# Patient Record
Sex: Female | Born: 1975 | Race: White | Hispanic: No | Marital: Married | Smoking: Former smoker
Health system: Southern US, Community
[De-identification: ages and names within clinical notes are randomized; demographics above are authoritative.]

## PROBLEM LIST (undated history)

## (undated) DIAGNOSIS — T753XXA Motion sickness, initial encounter: Secondary | ICD-10-CM

## (undated) DIAGNOSIS — R51 Headache: Secondary | ICD-10-CM

## (undated) DIAGNOSIS — R112 Nausea with vomiting, unspecified: Secondary | ICD-10-CM

## (undated) DIAGNOSIS — J329 Chronic sinusitis, unspecified: Secondary | ICD-10-CM

## (undated) DIAGNOSIS — Z803 Family history of malignant neoplasm of breast: Secondary | ICD-10-CM

## (undated) DIAGNOSIS — K219 Gastro-esophageal reflux disease without esophagitis: Secondary | ICD-10-CM

## (undated) DIAGNOSIS — R519 Headache, unspecified: Secondary | ICD-10-CM

## (undated) DIAGNOSIS — Z9889 Other specified postprocedural states: Secondary | ICD-10-CM

## (undated) DIAGNOSIS — J342 Deviated nasal septum: Secondary | ICD-10-CM

## (undated) HISTORY — DX: Family history of malignant neoplasm of breast: Z80.3

---
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
## 2007-09-25 ENCOUNTER — Emergency Department: Payer: Self-pay | Admitting: Internal Medicine

## 2007-09-27 HISTORY — PX: LAPAROSCOPY: SHX197

## 2007-10-19 ENCOUNTER — Ambulatory Visit: Payer: Self-pay | Admitting: Unknown Physician Specialty

## 2007-10-25 ENCOUNTER — Ambulatory Visit: Payer: Self-pay | Admitting: Unknown Physician Specialty

## 2008-06-06 ENCOUNTER — Ambulatory Visit: Payer: Self-pay | Admitting: Urology

## 2008-08-28 ENCOUNTER — Ambulatory Visit: Payer: Self-pay | Admitting: Obstetrics & Gynecology

## 2009-07-11 ENCOUNTER — Observation Stay: Payer: Self-pay | Admitting: Obstetrics and Gynecology

## 2009-07-22 ENCOUNTER — Inpatient Hospital Stay: Payer: Self-pay

## 2013-08-23 ENCOUNTER — Observation Stay: Payer: Self-pay

## 2013-08-28 ENCOUNTER — Inpatient Hospital Stay: Payer: Self-pay | Admitting: Obstetrics and Gynecology

## 2013-08-28 LAB — CBC WITH DIFFERENTIAL/PLATELET
Basophil #: 0 10*3/uL (ref 0.0–0.1)
Basophil %: 0.4 %
Eosinophil #: 0.1 10*3/uL (ref 0.0–0.7)
Eosinophil %: 1.3 %
HCT: 35.3 % (ref 35.0–47.0)
HGB: 12.3 g/dL (ref 12.0–16.0)
Lymphocyte #: 1.1 10*3/uL (ref 1.0–3.6)
Lymphocyte %: 15.6 %
MCH: 29.8 pg (ref 26.0–34.0)
MCHC: 34.8 g/dL (ref 32.0–36.0)
MCV: 86 fL (ref 80–100)
Monocyte #: 0.4 x10 3/mm (ref 0.2–0.9)
Monocyte %: 6 %
Neutrophil #: 5.6 10*3/uL (ref 1.4–6.5)
Neutrophil %: 76.7 %
Platelet: 138 10*3/uL — ABNORMAL LOW (ref 150–440)
RBC: 4.13 10*6/uL (ref 3.80–5.20)
RDW: 14.1 % (ref 11.5–14.5)
WBC: 7.3 10*3/uL (ref 3.6–11.0)

## 2013-08-29 LAB — HEMATOCRIT: HCT: 32.7 % — ABNORMAL LOW (ref 35.0–47.0)

## 2014-01-15 ENCOUNTER — Ambulatory Visit: Payer: Self-pay | Admitting: Obstetrics & Gynecology

## 2014-01-15 LAB — CBC
HCT: 40.8 % (ref 35.0–47.0)
HGB: 14.1 g/dL (ref 12.0–16.0)
MCH: 30 pg (ref 26.0–34.0)
MCHC: 34.5 g/dL (ref 32.0–36.0)
MCV: 87 fL (ref 80–100)
Platelet: 180 10*3/uL (ref 150–440)
RBC: 4.69 10*6/uL (ref 3.80–5.20)
RDW: 13.3 % (ref 11.5–14.5)
WBC: 5.3 10*3/uL (ref 3.6–11.0)

## 2014-01-21 ENCOUNTER — Ambulatory Visit: Payer: Self-pay | Admitting: Obstetrics & Gynecology

## 2014-01-21 HISTORY — PX: TUBAL LIGATION: SHX77

## 2014-04-22 ENCOUNTER — Ambulatory Visit: Payer: Self-pay | Admitting: Family Medicine

## 2015-01-17 NOTE — Op Note (Signed)
PATIENT NAME:  Desiree Lee, Desiree Lee MR#:  [id number] DATE OF BIRTH:  1975-11-24  DATE OF PROCEDURE:  01/21/2014  PREOPERATIVE DIAGNOSIS: Desire for permanent sterility.   POSTOPERATIVE DIAGNOSIS: Desire for permanent sterility.  PROCEDURE PERFORMED: Operative laparoscopy with bilateral tubal ligation using Hulka clips.   SURGEON: Barnett Applebaum, M.D.   ANESTHESIA: General.   ESTIMATED BLOOD LOSS: Minimal.   COMPLICATIONS: None.   FINDINGS: Normal tubes, ovaries, uterus, appendix, gallbladder and liver.   DISPOSITION: To the recovery room in stable condition.   TECHNIQUE: The patient is prepped and draped in the usual sterile fashion after adequate anesthesia is obtained in the dorsal lithotomy position. Bladder was drained with a Robinson catheter and a sponge stick is placed in the vagina for manipulation purposes.   Attention is then turned to the abdomen where a Veress needle is inserted through a 5 mm infraumbilical incision after Marcaine is used to anesthetize the skin. Veress needle placement is confirmed using the hanging drop technique and the abdomen is then insufflated with CO2 gas. A 5 mm trocar is then inserted under direct visualization with the laparoscope with no injuries or bleeding noted. The patient is placed in Trendelenburg position and the above-mentioned findings are visualized.   A suprapubic incision is made and an 11 mm trocar is placed. Hulka clips were applied to the left fallopian tube after it was identified out to its fimbria. Two Hulka clips are placed without difficulty. On the right side, 2 Hulka clips are placed in the midportion of fallopian tube after the fimbria is identified. Excellent placement is noted for all 4 Hulka clips and no bleeding or complications are noted. The patient is leveled. Gas is expelled. Trocars are removed and Dermabond is applied to skin. Sponge stick is removed vaginally. The patient goes to the recovery room in stable condition. All  sponge, instrument, and needle counts are correct.   ____________________________ R. Barnett Applebaum, MD rph:aw D: 01/21/2014 09:46:47 ET T: 01/21/2014 10:52:43 ET JOB#: [id number]  cc: Glean Salen, MD, <Dictator> Gae Dry MD ELECTRONICALLY SIGNED 01/21/2014 13:23

## 2015-02-03 NOTE — H&P (Signed)
L&D Evaluation:  History:  HPI 39 year old G5 P2022 with EDC=08/29/2013 by LMP=11/22/2012 and confirmed with a 6wk1day [place] (-3 days) presented at 20 1/7 weeks with c/o contractions since 0830 this AM. Denies VB, LOF. Baby active.PNC at [facility] remarkable for [place] with negative NIFT and an elevated 1 hr GTT with a normal 3hr GTT.  O POS, RI, VI, GBS positive.   Presents with contractions   Patient's Medical History Past hx of bipolar disorder-not on meds, allergic rhinitis, endometriosis   Patient's Surgical History dx laparoscopy   Medications Pre [place] Vitamins  GERD med (Prevacid vs Prilosec)   Allergies Bactrim-hives   Social History none   Family History Non-Contributory   Exam:  Vital Signs stable  118/72   Urine Protein not completed   General no apparent distress   Mental Status clear   Chest clear   Abdomen gravid, tender with contractions   Estimated Fetal Weight Average for gestational age   Fetal Position OP   Edema no edema   Reflexes 1+   Pelvic no external lesions, after 5 1/2 hours of observation, cx remains 1.5/50-60%/-1/posterior   Mebranes Intact   FHT normal rate with no decels, 135-140 with accels to 160s -170s   FHT Description CAT 1   Ucx q4-73min apart   Skin dry   Impression:  Impression IUP at 39 1/7 weeks. False vs prodromal labor   Plan:  Plan DC home with labor precautions. Discussed comfort measures-bath, rest, food/fluids.   Electronic Signatures: Karene Fry (CNM)  [phone] 22:39)  Authored: L&D Evaluation   Last Updated: 28-Nov-14 22:39 by Karene Fry (CNM)

## 2015-02-03 NOTE — H&P (Signed)
L&D Evaluation:  History:  HPI 39 year old G5 P2022 with EDC=08/29/2013 by LMP=11/22/2012 and confirmed with a 6wk1day [place] (-3 days) presented at 27 6/7 weeks for an elective IOL due to hx of fast active labor.  Denies VB. Has had a show yesterday. Has irregular ctxs. Baby active.PNC at [facility] remarkable for [facility] with negative NIFT and an elevated 1 hr GTT with a normal 3hr GTT. She takes Prilosec for reflux/heartburn. O POS, RI, VI, GBS positive. She had her TDAP and flu vaccine on 06/19/13.   Presents with IOL   Patient's Medical History Past hx of bipolar disorder-not on meds, allergic rhinitis, endometriosis, GERD   Patient's Surgical History dx laparoscopy   Medications Pre [place] Vitamins  Prilosec 20 mgm once or twice a day. Zyrtec   Allergies Bactrim-hives   Social History none   Family History Non-Contributory   ROS:  ROS see HPI   Exam:  Vital Signs 122/70   Urine Protein not completed   General no apparent distress   Mental Status clear   Chest clear   Heart normal sinus rhythm, no murmur/gallop/rubs   Abdomen gravid, non-tender   Estimated Fetal Weight Average for gestational age   Fetal Position cephalic   Edema trace   Reflexes 1+   Pelvic no external lesions, 3/60%/-1   Mebranes Intact   FHT normal rate with no decels, 135 baseline with accels- Cat1   Fetal Heart Rate 135   Ucx occ   Skin dry   Impression:  Impression IUP at 39 6/7 weeks for elective IOL. +GBS   Plan:  Plan EFM/NST, monitor contractions and for cervical change, antibiotics for GBBS prophylaxis, Will start GBS prophyllaxis, then Pitocin. Explained risks of hyoperstimulation, FITL, C-section, and failed IOL. Patient adamant about proceeding with IOL.   Electronic Signatures: Karene Fry (CNM)  (Signed 03-Dec-14 09:33)  Authored: L&D Evaluation   Last Updated: 03-Dec-14 09:33 by Karene Fry (CNM)

## 2016-01-20 ENCOUNTER — Encounter: Payer: Self-pay | Admitting: *Deleted

## 2016-01-25 NOTE — Discharge Instructions (Signed)
[facility] °[facility] °ENDOSCOPIC SINUS SURGERY °[facility] ° °What is Functional Endoscopic Sinus Surgery? ° The Surgery involves making the natural openings of the sinuses larger by removing the bony partitions that separate the sinuses from the nasal cavity.  The natural sinus lining is preserved as much as possible to allow the sinuses to resume normal function after the surgery.  In some patients nasal polyps (excessively swollen lining of the sinuses) may be removed to relieve obstruction of the sinus openings.  The surgery is performed through the nose using lighted scopes, which eliminates the need for incisions on the face.  A septoplasty is a different procedure which is sometimes performed with sinus surgery.  It involves straightening the boy partition that separates the two sides of your nose.  A crooked or deviated septum may need repair if is obstructing the sinuses or nasal airflow.  Turbinate reduction is also often performed during sinus surgery.  The turbinates are bony proturberances from the side walls of the nose which swell and can obstruct the nose in patients with sinus and allergy problems.  Their size can be surgically reduced to help relieve nasal obstruction. ° °What Can Sinus Surgery Do For Me? ° Sinus surgery can reduce the frequency of sinus infections requiring antibiotic treatment.  This can provide improvement in nasal congestion, post-nasal drainage, facial pressure and nasal obstruction.  Surgery will NOT prevent you from ever having an infection again, so it usually only for patients who get infections 4 or more times yearly requiring antibiotics, or for infections that do not clear with antibiotics.  It will not cure nasal allergies, so patients with allergies may still require medication to treat their allergies after surgery. Surgery may improve headaches related to sinusitis, however, some people will continue to  require medication to control sinus headaches related to allergies.  Surgery will do nothing for other forms of headache (migraine, tension or cluster). ° °What Are the Risks of Endoscopic Sinus Surgery? ° Current techniques allow surgery to be performed safely with little risk, however, there are rare complications that patients should be aware of.  Because the sinuses are located around the eyes, there is risk of eye injury, including blindness, though again, this would be quite rare. This is usually a result of bleeding behind the eye during surgery, which puts the vision oat risk, though there are treatments to protect the vision and prevent permanent disrupted by surgery causing a leak of the spinal fluid that surrounds the brain.  More serious complications would include bleeding inside the brain cavity or damage to the brain.  Again, all of these complications are uncommon, and spinal fluid leaks can be safely managed surgically if they occur.  The most common complication of sinus surgery is bleeding from the nose, which may require packing or cauterization of the nose.  Continued sinus have polyps may experience recurrence of the polyps requiring revision surgery.  Alterations of sense of smell or injury to the tear ducts are also rare complications.  ° °What is the Surgery Like, and what is the Recovery? ° The Surgery usually takes a couple of hours to perform, and is usually performed under a general anesthetic (completely asleep).  Patients are usually discharged home after a couple of hours.  Sometimes during surgery it is necessary to pack the nose to control bleeding, and the packing is left in place for 24 - 48 hours, and removed by your surgeon.    If a septoplasty was performed during the procedure, there is often a splint placed which must be removed after 5-7 days.   °Discomfort: Pain is usually mild to moderate, and can be controlled by prescription pain medication or acetaminophen (Tylenol).   Aspirin, Ibuprofen (Advil, Motrin), or Naprosyn (Aleve) should be avoided, as they can cause increased bleeding.  Most patients feel sinus pressure like they have a bad head cold for several days.  Sleeping with your head elevated can help reduce swelling and facial pressure, as can ice packs over the face.  A humidifier may be helpful to keep the mucous and blood from drying in the nose.  ° °Diet: There are no specific diet restrictions, however, you should generally start with clear liquids and a light diet of bland foods because the anesthetic can cause some nausea.  Advance your diet depending on how your stomach feels.  Taking your pain medication with food will often help reduce stomach upset which pain medications can cause. ° °Nasal Saline Irrigation: It is important to remove blood clots and dried mucous from the nose as it is healing.  This is done by having you irrigate the nose at least 3 - 4 times daily with a salt water solution.  We recommend using NeilMed Sinus Rinse (available at the drug store).  Fill the squeeze bottle with the solution, bend over a sink, and insert the tip of the squeeze bottle into the nose ½ of an inch.  Point the tip of the squeeze bottle towards the inside corner of the eye on the same side your irrigating.  Squeeze the bottle and gently irrigate the nose.  If you bend forward as you do this, most of the fluid will flow back out of the nose, instead of down your throat.   The solution should be warm, near body temperature, when you irrigate.   Each time you irrigate, you should use a full squeeze bottle.  ° °Note that if you are instructed to use Nasal Steroid Sprays at any time after your surgery, irrigate with saline BEFORE using the steroid spray, so you do not wash it all out of the nose. °Another product, Nasal Saline Gel (such as AYR Nasal Saline Gel) can be applied in each nostril 3 - 4 times daily to moisture the nose and reduce scabbing or crusting. ° °Bleeding:   Bloody drainage from the nose can be expected for several days, and patients are instructed to irrigate their nose frequently with salt water to help remove mucous and blood clots.  The drainage may be dark red or brown, though some fresh blood may be seen intermittently, especially after irrigation.  Do not blow you nose, as bleeding may occur. If you must sneeze, keep your mouth open to allow air to escape through your mouth. ° °If heavy bleeding occurs: Irrigate the nose with saline to rinse out clots, then spray the nose 3 - 4 times with Afrin Nasal Decongestant Spray.  The spray will constrict the blood vessels to slow bleeding.  Pinch the lower half of your nose shut to apply pressure, and lay down with your head elevated.  Ice packs over the nose may help as well. If bleeding persists despite these measures, you should notify your doctor.  Do not use the Afrin routinely to control nasal congestion after surgery, as it can result in worsening congestion and may affect healing.  ° ° ° °Activity: Return to work varies among patients. Most patients will be   out of work at least 5 - 7 days to recover.  Patient may return to work after they are off of narcotic pain medication, and feeling well enough to perform the functions of their job.  Patients must avoid heavy lifting (over 10 pounds) or strenuous physical for 2 weeks after surgery, so your employer may need to assign you to light duty, or keep you out of work longer if light duty is not possible.  NOTE: you should not drive, operate dangerous machinery, do any mentally demanding tasks or make any important legal or financial decisions while on narcotic pain medication and recovering from the general anesthetic.  °  °Call Your Doctor Immediately if You Have Any of the Following: °1. Bleeding that you cannot control with the above measures °2. Loss of vision, double vision, bulging of the eye or black eyes. °3. Fever over 101 degrees °4. Neck stiffness with  severe headache, fever, nausea and change in mental state. °You are always encourage to call anytime with concerns, however, please call with requests for pain medication refills during office hours. ° °Office Endoscopy: During follow-up visits your doctor will remove any packing or splints that may have been placed and evaluate and clean your sinuses endoscopically.  Topical anesthetic will be used to make this as comfortable as possible, though you may want to take your pain medication prior to the visit.  How often this will need to be done varies from patient to patient.  After complete recovery from the surgery, you may need follow-up endoscopy from time to time, particularly if there is concern of recurrent infection or nasal polyps. ° °General Anesthesia, Adult, Care After °Refer to this sheet in the next few weeks. These instructions provide you with information on caring for yourself after your procedure. Your health care provider may also give you more specific instructions. Your treatment has been planned according to current medical practices, but problems sometimes occur. Call your health care provider if you have any problems or questions after your procedure. °WHAT TO EXPECT AFTER THE PROCEDURE °After the procedure, it is typical to experience: °· Sleepiness. °· Nausea and vomiting. °HOME CARE INSTRUCTIONS °· For the first 24 hours after general anesthesia: °¨ Have a responsible person with you. °¨ Do not drive a car. If you are alone, do not take public transportation. °¨ Do not drink alcohol. °¨ Do not take medicine that has not been prescribed by your health care provider. °¨ Do not sign important papers or make important decisions. °¨ You may resume a normal diet and activities as directed by your health care provider. °· Change bandages (dressings) as directed. °· If you have questions or problems that seem related to general anesthesia, call the hospital and ask for the anesthetist or  anesthesiologist on call. °SEEK MEDICAL CARE IF: °· You have nausea and vomiting that continue the day after anesthesia. °· You develop a rash. °SEEK IMMEDIATE MEDICAL CARE IF:  °· You have difficulty breathing. °· You have chest pain. °· You have any allergic problems. °  °This information is not intended to replace advice given to you by your health care provider. Make sure you discuss any questions you have with your health care provider. °  °Document Released: 12/19/2000 Document Revised: 10/03/2014 Document Reviewed: 01/11/2012 °Elsevier Interactive Patient Education ©2016 [employer] ° °

## 2016-01-29 ENCOUNTER — Ambulatory Visit: Payer: Commercial Managed Care - HMO | Admitting: Student in an Organized Health Care Education/Training Program

## 2016-01-29 ENCOUNTER — Ambulatory Visit
Admission: RE | Admit: 2016-01-29 | Discharge: 2016-01-29 | Disposition: A | Discharge status: Home / Self Care | Payer: Commercial Managed Care - HMO | Source: Ambulatory Visit | Attending: Unknown Physician Specialty | Admitting: Unknown Physician Specialty

## 2016-01-29 ENCOUNTER — Encounter
Admission: RE | Disposition: A | Discharge status: Home / Self Care | Payer: Self-pay | Source: Ambulatory Visit | Attending: Unknown Physician Specialty

## 2016-01-29 DIAGNOSIS — Z825 Family history of asthma and other chronic lower respiratory diseases: Secondary | ICD-10-CM | POA: Diagnosis not present

## 2016-01-29 DIAGNOSIS — Z9851 Tubal ligation status: Secondary | ICD-10-CM | POA: Diagnosis not present

## 2016-01-29 DIAGNOSIS — Z7951 Long term (current) use of inhaled steroids: Secondary | ICD-10-CM | POA: Insufficient documentation

## 2016-01-29 DIAGNOSIS — J338 Other polyp of sinus: Secondary | ICD-10-CM | POA: Diagnosis not present

## 2016-01-29 DIAGNOSIS — Z8349 Family history of other endocrine, nutritional and metabolic diseases: Secondary | ICD-10-CM | POA: Diagnosis not present

## 2016-01-29 DIAGNOSIS — Z8249 Family history of ischemic heart disease and other diseases of the circulatory system: Secondary | ICD-10-CM | POA: Insufficient documentation

## 2016-01-29 DIAGNOSIS — Z881 Allergy status to other antibiotic agents status: Secondary | ICD-10-CM | POA: Diagnosis not present

## 2016-01-29 DIAGNOSIS — J329 Chronic sinusitis, unspecified: Secondary | ICD-10-CM | POA: Insufficient documentation

## 2016-01-29 DIAGNOSIS — Z79899 Other long term (current) drug therapy: Secondary | ICD-10-CM | POA: Insufficient documentation

## 2016-01-29 DIAGNOSIS — J3489 Other specified disorders of nose and nasal sinuses: Secondary | ICD-10-CM | POA: Diagnosis present

## 2016-01-29 DIAGNOSIS — J343 Hypertrophy of nasal turbinates: Secondary | ICD-10-CM | POA: Diagnosis not present

## 2016-01-29 DIAGNOSIS — Z8261 Family history of arthritis: Secondary | ICD-10-CM | POA: Insufficient documentation

## 2016-01-29 DIAGNOSIS — K219 Gastro-esophageal reflux disease without esophagitis: Secondary | ICD-10-CM | POA: Diagnosis not present

## 2016-01-29 DIAGNOSIS — Z87891 Personal history of nicotine dependence: Secondary | ICD-10-CM | POA: Insufficient documentation

## 2016-01-29 HISTORY — DX: Headache, unspecified: R51.9

## 2016-01-29 HISTORY — DX: Nausea with vomiting, unspecified: R11.2

## 2016-01-29 HISTORY — PX: IMAGE GUIDED SINUS SURGERY: SHX6570

## 2016-01-29 HISTORY — DX: Deviated nasal septum: J34.2

## 2016-01-29 HISTORY — DX: Gastro-esophageal reflux disease without esophagitis: K21.9

## 2016-01-29 HISTORY — PX: SEPTOPLASTY: SHX2393

## 2016-01-29 HISTORY — PX: NASAL TURBINATE REDUCTION: SHX2072

## 2016-01-29 HISTORY — DX: Headache: R51

## 2016-01-29 HISTORY — PX: MAXILLARY ANTROSTOMY: SHX2003

## 2016-01-29 HISTORY — DX: Other specified postprocedural states: Z98.890

## 2016-01-29 HISTORY — DX: Motion sickness, initial encounter: T75.3XXA

## 2016-01-29 HISTORY — DX: Chronic sinusitis, unspecified: J32.9

## 2016-01-29 SURGERY — SEPTOPLASTY, NOSE
Anesthesia: General | Wound class: Clean Contaminated

## 2016-01-29 MED ORDER — PROPOFOL 10 MG/ML IV BOLUS
INTRAVENOUS | Status: DC | PRN
  Administered 2016-01-29: 160 mg via INTRAVENOUS

## 2016-01-29 MED ORDER — ONDANSETRON HCL 4 MG/2ML IJ SOLN
INTRAMUSCULAR | Status: DC | PRN
  Administered 2016-01-29: 4 mg via INTRAVENOUS

## 2016-01-29 MED ORDER — FENTANYL CITRATE (PF) 100 MCG/2ML IJ SOLN
INTRAMUSCULAR | Status: DC | PRN
  Administered 2016-01-29: 100 ug via INTRAVENOUS

## 2016-01-29 MED ORDER — SCOPOLAMINE 1 MG/3DAYS TD PT72: 1.0000 | MEDICATED_PATCH | TRANSDERMAL | Status: DC

## 2016-01-29 MED ORDER — GLYCOPYRROLATE 0.2 MG/ML IJ SOLN
INTRAMUSCULAR | Status: DC | PRN
  Administered 2016-01-29: 0.1 mg via INTRAVENOUS

## 2016-01-29 MED ORDER — CLINDAMYCIN HCL 300 MG PO CAPS: 300.0000 mg | ORAL_CAPSULE | Freq: Four times a day (QID) | ORAL | Status: DC

## 2016-01-29 MED ORDER — LIDOCAINE HCL 1 % IJ SOLN
INTRAMUSCULAR | Status: DC | PRN
  Administered 2016-01-29: 15 mL via TOPICAL

## 2016-01-29 MED ORDER — ACETAMINOPHEN 10 MG/ML IV SOLN
1000.0000 mg | Freq: Once | INTRAVENOUS | Status: AC
  Administered 2016-01-29: 1000 mg via INTRAVENOUS

## 2016-01-29 MED ORDER — LIDOCAINE-EPINEPHRINE 1 %-1:100000 IJ SOLN
INTRAMUSCULAR | Status: DC | PRN
  Administered 2016-01-29: 12 mL

## 2016-01-29 MED ORDER — SCOPOLAMINE 1 MG/3DAYS TD PT72
1.0000 | MEDICATED_PATCH | TRANSDERMAL | Status: DC
  Administered 2016-01-29: 1.5 mg via TRANSDERMAL

## 2016-01-29 MED ORDER — OXYCODONE HCL 5 MG PO TABS: 5.0000 mg | ORAL_TABLET | Freq: Once | ORAL | Status: DC | PRN

## 2016-01-29 MED ORDER — HYDROMORPHONE HCL 1 MG/ML IJ SOLN: 0.2500 mg | INTRAMUSCULAR | Status: DC | PRN

## 2016-01-29 MED ORDER — OXYCODONE-ACETAMINOPHEN 5-325 MG PO TABS: 1.0000 | ORAL_TABLET | ORAL | Status: DC | PRN

## 2016-01-29 MED ORDER — METOCLOPRAMIDE HCL 5 MG/ML IJ SOLN
INTRAMUSCULAR | Status: DC | PRN
  Administered 2016-01-29: 10 mg via INTRAVENOUS

## 2016-01-29 MED ORDER — PROMETHAZINE HCL 25 MG/ML IJ SOLN
6.2500 mg | INTRAMUSCULAR | Status: DC | PRN
Start: 2016-01-29 — End: 2016-01-29
  Administered 2016-01-29: 6.25 mg via INTRAVENOUS

## 2016-01-29 MED ORDER — ROCURONIUM BROMIDE 100 MG/10ML IV SOLN
INTRAVENOUS | Status: DC | PRN
  Administered 2016-01-29: 25 mg via INTRAVENOUS

## 2016-01-29 MED ORDER — OXYMETAZOLINE HCL 0.05 % NA SOLN
6.0000 | Freq: Once | NASAL | Status: AC
  Administered 2016-01-29: 6 via NASAL

## 2016-01-29 MED ORDER — OXYCODONE HCL 5 MG/5ML PO SOLN: 5.0000 mg | Freq: Once | ORAL | Status: DC | PRN

## 2016-01-29 MED ORDER — DEXAMETHASONE SODIUM PHOSPHATE 4 MG/ML IJ SOLN
INTRAMUSCULAR | Status: DC | PRN
Start: 2016-01-29 — End: 2016-01-29
  Administered 2016-01-29: 10 mg via INTRAVENOUS

## 2016-01-29 MED ORDER — MIDAZOLAM HCL 5 MG/5ML IJ SOLN
INTRAMUSCULAR | Status: DC | PRN
  Administered 2016-01-29: 2 mg via INTRAVENOUS

## 2016-01-29 MED ORDER — LACTATED RINGERS IV SOLN
INTRAVENOUS | Status: DC
  Administered 2016-01-29 (×2): via INTRAVENOUS

## 2016-01-29 MED ORDER — MEPERIDINE HCL 25 MG/ML IJ SOLN: 6.2500 mg | INTRAMUSCULAR | Status: DC | PRN

## 2016-01-29 MED ORDER — LIDOCAINE HCL (CARDIAC) 20 MG/ML IV SOLN
INTRAVENOUS | Status: DC | PRN
  Administered 2016-01-29: 40 mg via INTRAVENOUS

## 2016-01-29 SURGICAL SUPPLY — 34 items
BATTERY INSTRU NAVIGATION (MISCELLANEOUS) ×16 IMPLANT
BLADE SURG 15 STRL LF DISP TIS (BLADE) IMPLANT
BLADE SURG 15 STRL SS (BLADE)
CANISTER SUCT 1200ML W/VALVE (MISCELLANEOUS) ×4 IMPLANT
COAG SUCT 10F 3.5MM HAND CTRL (MISCELLANEOUS) ×4 IMPLANT
CUP MEDICINE 2OZ PLAST GRAD ST (MISCELLANEOUS) ×4 IMPLANT
DRAPE HEAD BAR (DRAPES) ×4 IMPLANT
DRESSING NASL FOAM PST OP SINU (MISCELLANEOUS) ×4 IMPLANT
DRSG NASAL FOAM POST OP SINU (MISCELLANEOUS) ×8
GLOVE BIO SURGEON STRL SZ7.5 (GLOVE) ×8 IMPLANT
HANDLE YANKAUER SUCT BULB TIP (MISCELLANEOUS) ×4 IMPLANT
KIT ROOM TURNOVER OR (KITS) ×4 IMPLANT
NAVIGATION MASK REG  ST (MISCELLANEOUS) ×4 IMPLANT
NEEDLE HYPO 25GX1X1/2 BEV (NEEDLE) ×4 IMPLANT
NS IRRIG 500ML POUR BTL (IV SOLUTION) ×4 IMPLANT
PACK DRAPE NASAL/ENT (PACKS) ×4 IMPLANT
PAD GROUND ADULT SPLIT (MISCELLANEOUS) ×4 IMPLANT
SOL ANTI-FOG 6CC FOG-OUT (MISCELLANEOUS) ×2 IMPLANT
SOL FOG-OUT ANTI-FOG 6CC (MISCELLANEOUS) ×2
SPLINT NASAL SEPTAL BLV .25 LG (MISCELLANEOUS) IMPLANT
SPLINT NASAL SEPTAL BLV .50 ST (MISCELLANEOUS) ×4 IMPLANT
SPONGE NEURO XRAY DETECT 1X3 (DISPOSABLE) ×4 IMPLANT
STRAP BODY AND KNEE 60X3 (MISCELLANEOUS) ×4 IMPLANT
SUT CHROMIC 3-0 (SUTURE) ×2
SUT CHROMIC 3-0 KS 27XMFL CR (SUTURE) ×2
SUT CHROMIC 5-0 (SUTURE)
SUT CHROMIC 5-0 P2 18XMFL CR (SUTURE)
SUT ETHILON 3-0 KS 30 BLK (SUTURE) ×4 IMPLANT
SUT PLAIN GUT 4-0 (SUTURE) IMPLANT
SUTURE CHRMC 3-0 KS 27XMFL CR (SUTURE) ×2 IMPLANT
SUTURE CHRMC 5-0 P2 18XMF CR (SUTURE) IMPLANT
SYRINGE 10CC LL (SYRINGE) ×4 IMPLANT
TOWEL OR 17X26 4PK STRL BLUE (TOWEL DISPOSABLE) ×4 IMPLANT
WATER STERILE IRR 500ML POUR (IV SOLUTION) ×4 IMPLANT

## 2016-01-29 NOTE — Op Note (Signed)
PREOPERATIVE DIAGNOSIS:  Chronic nasal obstruction.  POSTOPERATIVE DIAGNOSIS:  Chronic nasal obstruction.  SURGEON:  Roena Malady, M.D.  NAME OF PROCEDURE:  1. Nasal septoplasty. 2. Submucous resection of inferior turbinates. 3.  Stryker navigation 4.  Endoscopic maxillary antrostomy with removal of tissue 5.  Submucous resection ant tip middle turbinates bilaterally OPERATIVE FINDINGS:  Severe nasal septal deformity, hypertrophy of the inferior turbinates, polypoid disease middle turbinates  DESCRIPTION OF THE PROCEDURE:  Desiree Lee was identified in the holding area and taken to the operating room and placed in the supine position.  After general endotracheal anesthesia was induced, the table was turned 45 degrees and the patient was placed in a semi-Fowler position.  the Stryker navigation unit was applied and calibrated and left on throughout the procedure. The nose was then topically anesthetized with Lidocaine, cotton pledgets were placed within each nostril. After approximately 5 minutes, this was removed at which time a local anesthetic of 1% Lidocaine 1:100,000 units of Epinephrine was used to inject the inferior turbinates in the nasal septum. A total of 85ml ml was used. Examination of the nose showed a severe Right nasal septal deformity and tremendous hypertrophied inferior turbinate.  Beginning on the right hand side a hemitransfixion incision was then created on the leading edge of the septum on the right.  A subperichondrial plane was elevated posteriorly on the left and taken back to the perpendicular plate of the ethmoid where subperiosteal plane was elevated posteriorly on the left. .  An inferior rim of cartilage was removed anteriorly with care taken to leave an anterior strut to prevent nasal collapse. With this strut removed the perpendicular plate of the ethmoid was separated from the quadrangular cartilage. The large septal deviation was removed.  The septum was then  replaced in the midline. Reinspection through each nostril showed excellent reduction of the septal deformity. A left posterior inferior fenestration was then created to allow hematoma drainage.  With the septoplasty completed, the operation proceeded with the endoscopic sinus surgery. The 0 endoscope was introduced into the left nostril. The middle turbinate had some polypoid tissue on the tip which was obstructing the ostiomeatal unit therefore the Tru cuts were used to excise the anterior tip of the middle turbinate allowing access to the osto meatal region. With the middle turbinate medialized using the Stryker navigation suction the uncinate process and opening to the maxillary sinus a leftward identified. The Cottle elevator was used to incise the uncinate process this was then removed using the straight forceps. Max or sinus was entered antrostomy was opened widely using straight and side biting forceps. There was thickened mucosa which was removed. In similar fashion antrostomy was created on the right again there was a large polypoid tip to the anterior portion of the middle turbinate. This too was removed using the Tru-Cut forceps. Again the uncinate process was taken down using the Cottle elevator and the antrostomy was opened widely using the straight and side biting forceps. Both maxillary sinuses opened the anterior tips of the middle turbinates were then cauterized using suction cautery for hemostasis. The operation then turned to the submucous resection of the inferior turbinates. Beginning on the left-hand side, a 15 blade was used to incise along the inferior edge of the inferior turbinate. A superior laterally based flap was then elevated. The underlying conchal bone of mucosa was excised using Knight scissors. The flap was then laid back over the turbinate stump and cauterized using suction cautery. In a similar fashion  the submucous resection was performed on the right.  With the submucous  resection completed bilaterally and no active bleeding, the hemitransfixion incision was then closed using two interrupted 3-0 chromic sutures.  Plastic nasal septal splints were placed within each nostril and affixed to the septum using a 3-0 nylon suture. Stammberger was then used beneath each inferior turbinate for hemostasis.    The patient tolerated the procedure well, was returned to anesthesia, extubated in the operating room, and taken to the recovery room in stable condition.    CULTURES:  None.  SPECIMENS:  sinus contents  ESTIMATED BLOOD LOSS:  25 cc.  Desiree Lee T  01/29/2016  1:44 PM

## 2016-01-29 NOTE — H&P (Signed)
  H+P  Reviewed and will be scanned in later. No changes noted. 

## 2016-01-29 NOTE — Transfer of Care (Signed)
Immediate Anesthesia Transfer of Care Note  Patient: Desiree Lee  Procedure(s) Performed: Procedure(s) with comments: SEPTOPLASTY (N/A) - DISK GAVE DISK TO TABITHA 4-06 KP TURBINATE REDUCTION/SUBMUCOSAL RESECTION and bilateral SMR middle turbinates. (Bilateral) ENDOSCOPIC MAXILLARY ANTROSTOMY WITH TISSUE (Bilateral) IMAGE GUIDED SINUS SURGERY (N/A)  Patient Location: PACU  Anesthesia Type: General  Level of Consciousness: awake, alert  and patient cooperative  Airway and Oxygen Therapy: Patient Spontanous Breathing and Patient connected to supplemental oxygen  Post-op Assessment: Post-op Vital signs reviewed, Patient's Cardiovascular Status Stable, Respiratory Function Stable, Patent Airway and No signs of Nausea or vomiting  Post-op Vital Signs: Reviewed and stable  Complications: No apparent anesthesia complications

## 2016-01-29 NOTE — Anesthesia Postprocedure Evaluation (Signed)
Anesthesia Post Note  Patient: [name]  Procedure(s) Performed: Procedure(s) (LRB): SEPTOPLASTY (N/A) TURBINATE REDUCTION/SUBMUCOSAL RESECTION and bilateral SMR middle turbinates. (Bilateral) ENDOSCOPIC MAXILLARY ANTROSTOMY WITH TISSUE (Bilateral) IMAGE GUIDED SINUS SURGERY (N/A)  Patient location during evaluation: PACU Anesthesia Type: General Level of consciousness: awake and alert and oriented Pain management: pain level controlled Vital Signs Assessment: post-procedure vital signs reviewed and stable Respiratory status: spontaneous breathing and nonlabored ventilation Cardiovascular status: stable Postop Assessment: no signs of nausea or vomiting and adequate PO intake Anesthetic complications: no    Estill Batten

## 2016-01-29 NOTE — Anesthesia Procedure Notes (Signed)
Procedure Name: Intubation Date/Time: 01/29/2016 12:55 PM Performed by: Mayme Genta Pre-anesthesia Checklist: Patient identified, Emergency Drugs available, Suction available, Patient being monitored and Timeout performed Patient Re-evaluated:Patient Re-evaluated prior to inductionOxygen Delivery Method: Circle system utilized Preoxygenation: Pre-oxygenation with 100% oxygen Intubation Type: IV induction Ventilation: Mask ventilation without difficulty Laryngoscope Size: Miller and 2 Grade View: Grade I Tube type: Oral Rae Tube size: 7.0 mm Number of attempts: 1 Placement Confirmation: ETT inserted through vocal cords under direct vision,  positive ETCO2 and breath sounds checked- equal and bilateral Tube secured with: Tape Dental Injury: Teeth and Oropharynx as per pre-operative assessment

## 2016-01-29 NOTE — Anesthesia Preprocedure Evaluation (Signed)
Anesthesia Evaluation  Patient identified by MRN, date of birth, ID band  Reviewed: Allergy & Precautions, NPO status , Patient's Chart, lab work & pertinent test results  History of Anesthesia Complications (+) PONV and history of anesthetic complications  Airway Mallampati: I  TM Distance: >3 FB Neck ROM: Full    Dental no notable dental hx.    Pulmonary former smoker,    Pulmonary exam normal        Cardiovascular negative cardio ROS Normal cardiovascular exam     Neuro/Psych  Headaches,    GI/Hepatic Neg liver ROS, GERD  Controlled and Medicated,  Endo/Other  negative endocrine ROS  Renal/GU negative Renal ROS     Musculoskeletal negative musculoskeletal ROS (+)   Abdominal   Peds negative pediatric ROS (+)  Hematology negative hematology ROS (+)   Anesthesia Other Findings   Reproductive/Obstetrics                             Anesthesia Physical Anesthesia Plan  ASA: II  Anesthesia Plan: General   Post-op Pain Management:    Induction: Intravenous  Airway Management Planned: Oral ETT  Additional Equipment:   Intra-op Plan:   Post-operative Plan:   Informed Consent: I have reviewed the patients History and Physical, chart, labs and discussed the procedure including the risks, benefits and alternatives for the proposed anesthesia with the patient or authorized representative who has indicated his/her understanding and acceptance.     Plan Discussed with: CRNA  Anesthesia Plan Comments:         Anesthesia Quick Evaluation

## 2016-02-01 ENCOUNTER — Encounter: Payer: Self-pay | Admitting: Unknown Physician Specialty

## 2016-02-03 LAB — SURGICAL PATHOLOGY

## 2016-07-15 ENCOUNTER — Ambulatory Visit
Admission: RE | Admit: 2016-07-15 | Discharge: 2016-07-15 | Disposition: A | Discharge status: Home / Self Care | Payer: Commercial Managed Care - HMO | Source: Ambulatory Visit | Attending: Adult Health | Admitting: Adult Health

## 2016-07-15 ENCOUNTER — Other Ambulatory Visit: Payer: Self-pay | Admitting: Adult Health

## 2016-07-15 DIAGNOSIS — H539 Unspecified visual disturbance: Secondary | ICD-10-CM | POA: Diagnosis not present

## 2016-07-15 DIAGNOSIS — R4701 Aphasia: Secondary | ICD-10-CM | POA: Insufficient documentation

## 2016-07-15 MED ORDER — GADOBENATE DIMEGLUMINE 529 MG/ML IV SOLN
15.0000 mL | Freq: Once | INTRAVENOUS | Status: AC | PRN
  Administered 2016-07-15: 15 mL via INTRAVENOUS

## 2017-06-14 ENCOUNTER — Other Ambulatory Visit: Payer: Self-pay | Admitting: Family Medicine

## 2017-06-14 DIAGNOSIS — Z1231 Encounter for screening mammogram for malignant neoplasm of breast: Secondary | ICD-10-CM

## 2017-07-07 ENCOUNTER — Ambulatory Visit
Admission: RE | Admit: 2017-07-07 | Discharge: 2017-07-07 | Disposition: A | Discharge status: Home / Self Care | Payer: BLUE CROSS/BLUE SHIELD | Source: Ambulatory Visit | Attending: Family Medicine | Admitting: Family Medicine

## 2017-07-07 DIAGNOSIS — Z1231 Encounter for screening mammogram for malignant neoplasm of breast: Secondary | ICD-10-CM | POA: Diagnosis present

## 2018-05-23 DIAGNOSIS — J0100 Acute maxillary sinusitis, unspecified: Secondary | ICD-10-CM | POA: Diagnosis not present

## 2018-06-08 DIAGNOSIS — E559 Vitamin D deficiency, unspecified: Secondary | ICD-10-CM | POA: Diagnosis not present

## 2018-06-24 DIAGNOSIS — J0140 Acute pansinusitis, unspecified: Secondary | ICD-10-CM | POA: Diagnosis not present

## 2018-07-02 DIAGNOSIS — J329 Chronic sinusitis, unspecified: Secondary | ICD-10-CM | POA: Diagnosis not present

## 2018-07-23 DIAGNOSIS — J320 Chronic maxillary sinusitis: Secondary | ICD-10-CM | POA: Diagnosis not present

## 2018-07-23 DIAGNOSIS — J324 Chronic pansinusitis: Secondary | ICD-10-CM | POA: Diagnosis not present

## 2018-08-13 DIAGNOSIS — R51 Headache: Secondary | ICD-10-CM | POA: Diagnosis not present

## 2018-08-13 DIAGNOSIS — J320 Chronic maxillary sinusitis: Secondary | ICD-10-CM | POA: Diagnosis not present

## 2018-08-28 DIAGNOSIS — J029 Acute pharyngitis, unspecified: Secondary | ICD-10-CM | POA: Diagnosis not present

## 2018-12-21 DIAGNOSIS — H1031 Unspecified acute conjunctivitis, right eye: Secondary | ICD-10-CM | POA: Diagnosis not present

## 2019-09-06 ENCOUNTER — Other Ambulatory Visit: Payer: Self-pay | Admitting: Family Medicine

## 2019-09-06 DIAGNOSIS — Z1231 Encounter for screening mammogram for malignant neoplasm of breast: Secondary | ICD-10-CM

## 2019-09-23 ENCOUNTER — Other Ambulatory Visit: Payer: Self-pay

## 2019-09-23 ENCOUNTER — Ambulatory Visit
Admission: RE | Admit: 2019-09-23 | Discharge: 2019-09-23 | Disposition: A | Discharge status: Home / Self Care | Payer: 59 | Source: Ambulatory Visit | Attending: Family Medicine | Admitting: Family Medicine

## 2019-09-23 DIAGNOSIS — Z1231 Encounter for screening mammogram for malignant neoplasm of breast: Secondary | ICD-10-CM

## 2019-09-30 ENCOUNTER — Other Ambulatory Visit: Payer: Self-pay | Admitting: Family Medicine

## 2019-09-30 DIAGNOSIS — R928 Other abnormal and inconclusive findings on diagnostic imaging of breast: Secondary | ICD-10-CM

## 2019-09-30 DIAGNOSIS — N6310 Unspecified lump in the right breast, unspecified quadrant: Secondary | ICD-10-CM

## 2019-10-02 ENCOUNTER — Ambulatory Visit
Admission: RE | Admit: 2019-10-02 | Discharge: 2019-10-02 | Disposition: A | Discharge status: Home / Self Care | Payer: 59 | Source: Ambulatory Visit | Attending: Family Medicine | Admitting: Family Medicine

## 2019-10-02 DIAGNOSIS — R928 Other abnormal and inconclusive findings on diagnostic imaging of breast: Secondary | ICD-10-CM

## 2019-10-02 DIAGNOSIS — N6310 Unspecified lump in the right breast, unspecified quadrant: Secondary | ICD-10-CM

## 2019-10-07 ENCOUNTER — Other Ambulatory Visit: Payer: Self-pay | Admitting: Family Medicine

## 2019-10-07 DIAGNOSIS — R928 Other abnormal and inconclusive findings on diagnostic imaging of breast: Secondary | ICD-10-CM

## 2019-10-09 ENCOUNTER — Ambulatory Visit
Admission: RE | Admit: 2019-10-09 | Discharge: 2019-10-09 | Disposition: A | Discharge status: Home / Self Care | Payer: 59 | Source: Ambulatory Visit | Attending: Family Medicine | Admitting: Family Medicine

## 2019-10-09 DIAGNOSIS — R928 Other abnormal and inconclusive findings on diagnostic imaging of breast: Secondary | ICD-10-CM

## 2019-10-09 HISTORY — PX: BREAST BIOPSY: SHX20

## 2019-10-10 LAB — SURGICAL PATHOLOGY

## 2019-10-25 ENCOUNTER — Ambulatory Visit: Payer: Self-pay | Admitting: General Surgery

## 2019-10-25 DIAGNOSIS — N6489 Other specified disorders of breast: Secondary | ICD-10-CM

## 2019-10-30 ENCOUNTER — Other Ambulatory Visit: Payer: Self-pay | Admitting: General Surgery

## 2019-10-30 ENCOUNTER — Ambulatory Visit: Payer: Self-pay | Admitting: General Surgery

## 2019-10-30 DIAGNOSIS — N6489 Other specified disorders of breast: Secondary | ICD-10-CM

## 2019-12-11 ENCOUNTER — Other Ambulatory Visit: Payer: Self-pay

## 2019-12-11 ENCOUNTER — Encounter (HOSPITAL_BASED_OUTPATIENT_CLINIC_OR_DEPARTMENT_OTHER): Payer: Self-pay | Admitting: General Surgery

## 2019-12-16 ENCOUNTER — Other Ambulatory Visit (HOSPITAL_COMMUNITY)
Admission: RE | Admit: 2019-12-16 | Discharge: 2019-12-16 | Disposition: A | Discharge status: Home / Self Care | Payer: 59 | Source: Ambulatory Visit | Attending: General Surgery | Admitting: General Surgery

## 2019-12-16 DIAGNOSIS — Z803 Family history of malignant neoplasm of breast: Secondary | ICD-10-CM | POA: Diagnosis not present

## 2019-12-16 DIAGNOSIS — Z881 Allergy status to other antibiotic agents status: Secondary | ICD-10-CM | POA: Diagnosis not present

## 2019-12-16 DIAGNOSIS — L905 Scar conditions and fibrosis of skin: Secondary | ICD-10-CM | POA: Diagnosis present

## 2019-12-16 DIAGNOSIS — Z20822 Contact with and (suspected) exposure to covid-19: Secondary | ICD-10-CM | POA: Insufficient documentation

## 2019-12-16 DIAGNOSIS — N6082 Other benign mammary dysplasias of left breast: Secondary | ICD-10-CM | POA: Diagnosis not present

## 2019-12-16 DIAGNOSIS — Z01812 Encounter for preprocedural laboratory examination: Secondary | ICD-10-CM | POA: Insufficient documentation

## 2019-12-16 DIAGNOSIS — Z87891 Personal history of nicotine dependence: Secondary | ICD-10-CM | POA: Diagnosis not present

## 2019-12-16 LAB — SARS CORONAVIRUS 2 (TAT 6-24 HRS): SARS Coronavirus 2: NEGATIVE

## 2019-12-18 ENCOUNTER — Ambulatory Visit
Admission: RE | Admit: 2019-12-18 | Discharge: 2019-12-18 | Disposition: A | Discharge status: Home / Self Care | Payer: 59 | Source: Ambulatory Visit | Attending: General Surgery | Admitting: General Surgery

## 2019-12-18 ENCOUNTER — Other Ambulatory Visit: Payer: Self-pay

## 2019-12-18 DIAGNOSIS — N6489 Other specified disorders of breast: Secondary | ICD-10-CM

## 2019-12-19 ENCOUNTER — Ambulatory Visit (HOSPITAL_BASED_OUTPATIENT_CLINIC_OR_DEPARTMENT_OTHER)
Admission: RE | Admit: 2019-12-19 | Discharge: 2019-12-19 | Disposition: A | Discharge status: Home / Self Care | Payer: 59 | Attending: General Surgery | Admitting: General Surgery

## 2019-12-19 ENCOUNTER — Ambulatory Visit (HOSPITAL_BASED_OUTPATIENT_CLINIC_OR_DEPARTMENT_OTHER): Payer: 59 | Admitting: Certified Registered"

## 2019-12-19 ENCOUNTER — Other Ambulatory Visit: Payer: Self-pay

## 2019-12-19 ENCOUNTER — Ambulatory Visit
Admission: RE | Admit: 2019-12-19 | Discharge: 2019-12-19 | Disposition: A | Discharge status: Home / Self Care | Payer: 59 | Source: Ambulatory Visit | Attending: General Surgery | Admitting: General Surgery

## 2019-12-19 ENCOUNTER — Encounter (HOSPITAL_BASED_OUTPATIENT_CLINIC_OR_DEPARTMENT_OTHER): Payer: Self-pay | Admitting: General Surgery

## 2019-12-19 ENCOUNTER — Encounter (HOSPITAL_BASED_OUTPATIENT_CLINIC_OR_DEPARTMENT_OTHER)
Admission: RE | Disposition: A | Discharge status: Home / Self Care | Payer: Self-pay | Source: Home / Self Care | Attending: General Surgery

## 2019-12-19 DIAGNOSIS — Z881 Allergy status to other antibiotic agents status: Secondary | ICD-10-CM | POA: Insufficient documentation

## 2019-12-19 DIAGNOSIS — Z87891 Personal history of nicotine dependence: Secondary | ICD-10-CM | POA: Insufficient documentation

## 2019-12-19 DIAGNOSIS — N6082 Other benign mammary dysplasias of left breast: Secondary | ICD-10-CM | POA: Insufficient documentation

## 2019-12-19 DIAGNOSIS — Z803 Family history of malignant neoplasm of breast: Secondary | ICD-10-CM | POA: Insufficient documentation

## 2019-12-19 DIAGNOSIS — Z20822 Contact with and (suspected) exposure to covid-19: Secondary | ICD-10-CM | POA: Insufficient documentation

## 2019-12-19 DIAGNOSIS — L905 Scar conditions and fibrosis of skin: Secondary | ICD-10-CM | POA: Insufficient documentation

## 2019-12-19 DIAGNOSIS — N6489 Other specified disorders of breast: Secondary | ICD-10-CM

## 2019-12-19 HISTORY — PX: BREAST LUMPECTOMY: SHX2

## 2019-12-19 HISTORY — PX: BREAST LUMPECTOMY WITH RADIOACTIVE SEED LOCALIZATION: SHX6424

## 2019-12-19 LAB — POCT PREGNANCY, URINE: Preg Test, Ur: NEGATIVE

## 2019-12-19 SURGERY — BREAST LUMPECTOMY WITH RADIOACTIVE SEED LOCALIZATION
Anesthesia: General | Site: Breast | Laterality: Left

## 2019-12-19 MED ORDER — GABAPENTIN 300 MG PO CAPS
300.0000 mg | ORAL_CAPSULE | ORAL | Status: AC
  Administered 2019-12-19: 300 mg via ORAL

## 2019-12-19 MED ORDER — CHLORHEXIDINE GLUCONATE CLOTH 2 % EX PADS: 6.0000 | MEDICATED_PAD | Freq: Once | CUTANEOUS | Status: DC

## 2019-12-19 MED ORDER — CEFAZOLIN SODIUM-DEXTROSE 2-4 GM/100ML-% IV SOLN
INTRAVENOUS | Status: AC
  Filled 2019-12-19: qty 100

## 2019-12-19 MED ORDER — ACETAMINOPHEN 500 MG PO TABS
ORAL_TABLET | ORAL | Status: AC
  Filled 2019-12-19: qty 2

## 2019-12-19 MED ORDER — MEPERIDINE HCL 25 MG/ML IJ SOLN: 6.2500 mg | INTRAMUSCULAR | Status: DC | PRN

## 2019-12-19 MED ORDER — GABAPENTIN 300 MG PO CAPS
ORAL_CAPSULE | ORAL | Status: AC
  Filled 2019-12-19: qty 1

## 2019-12-19 MED ORDER — FENTANYL CITRATE (PF) 250 MCG/5ML IJ SOLN
INTRAMUSCULAR | Status: DC | PRN
  Administered 2019-12-19: 50 ug via INTRAVENOUS

## 2019-12-19 MED ORDER — ONDANSETRON HCL 4 MG/2ML IJ SOLN
INTRAMUSCULAR | Status: DC | PRN
  Administered 2019-12-19: 4 mg via INTRAVENOUS

## 2019-12-19 MED ORDER — DEXAMETHASONE SODIUM PHOSPHATE 10 MG/ML IJ SOLN
INTRAMUSCULAR | Status: DC | PRN
  Administered 2019-12-19: 4 mg via INTRAVENOUS

## 2019-12-19 MED ORDER — GABAPENTIN 300 MG PO CAPS: 300.0000 mg | ORAL_CAPSULE | ORAL | Status: DC

## 2019-12-19 MED ORDER — LIDOCAINE 2% (20 MG/ML) 5 ML SYRINGE
INTRAMUSCULAR | Status: DC | PRN
  Administered 2019-12-19: 40 mg via INTRAVENOUS

## 2019-12-19 MED ORDER — LACTATED RINGERS IV SOLN: INTRAVENOUS | Status: DC

## 2019-12-19 MED ORDER — OXYCODONE HCL 5 MG/5ML PO SOLN: 5.0000 mg | Freq: Once | ORAL | Status: AC | PRN

## 2019-12-19 MED ORDER — BUPIVACAINE HCL 0.25 % IJ SOLN
INTRAMUSCULAR | Status: DC | PRN
  Administered 2019-12-19: 20 mL

## 2019-12-19 MED ORDER — ACETAMINOPHEN 325 MG PO TABS: 325.0000 mg | ORAL_TABLET | Freq: Once | ORAL | Status: DC | PRN

## 2019-12-19 MED ORDER — FENTANYL CITRATE (PF) 100 MCG/2ML IJ SOLN: 25.0000 ug | INTRAMUSCULAR | Status: DC | PRN

## 2019-12-19 MED ORDER — LIDOCAINE 2% (20 MG/ML) 5 ML SYRINGE
INTRAMUSCULAR | Status: AC
  Filled 2019-12-19: qty 5

## 2019-12-19 MED ORDER — ONDANSETRON HCL 4 MG/2ML IJ SOLN
INTRAMUSCULAR | Status: AC
  Filled 2019-12-19: qty 2

## 2019-12-19 MED ORDER — MIDAZOLAM HCL 5 MG/5ML IJ SOLN
INTRAMUSCULAR | Status: DC | PRN
  Administered 2019-12-19: 2 mg via INTRAVENOUS

## 2019-12-19 MED ORDER — PROMETHAZINE HCL 25 MG/ML IJ SOLN: 6.2500 mg | INTRAMUSCULAR | Status: DC | PRN

## 2019-12-19 MED ORDER — OXYCODONE HCL 5 MG PO TABS
5.0000 mg | ORAL_TABLET | Freq: Once | ORAL | Status: AC | PRN
  Administered 2019-12-19: 5 mg via ORAL

## 2019-12-19 MED ORDER — ACETAMINOPHEN 10 MG/ML IV SOLN: 1000.0000 mg | Freq: Once | INTRAVENOUS | Status: DC | PRN

## 2019-12-19 MED ORDER — ACETAMINOPHEN 500 MG PO TABS: 1000.0000 mg | ORAL_TABLET | ORAL | Status: DC

## 2019-12-19 MED ORDER — PROPOFOL 10 MG/ML IV BOLUS
INTRAVENOUS | Status: AC
  Filled 2019-12-19: qty 20

## 2019-12-19 MED ORDER — CEFAZOLIN SODIUM-DEXTROSE 2-4 GM/100ML-% IV SOLN
2.0000 g | INTRAVENOUS | Status: AC
  Administered 2019-12-19: 2 g via INTRAVENOUS

## 2019-12-19 MED ORDER — OXYCODONE HCL 5 MG PO TABS
ORAL_TABLET | ORAL | Status: AC
  Filled 2019-12-19: qty 1

## 2019-12-19 MED ORDER — MIDAZOLAM HCL 2 MG/2ML IJ SOLN
INTRAMUSCULAR | Status: AC
  Filled 2019-12-19: qty 2

## 2019-12-19 MED ORDER — PROPOFOL 10 MG/ML IV BOLUS
INTRAVENOUS | Status: DC | PRN
  Administered 2019-12-19: 150 mg via INTRAVENOUS

## 2019-12-19 MED ORDER — DEXAMETHASONE SODIUM PHOSPHATE 10 MG/ML IJ SOLN
INTRAMUSCULAR | Status: AC
  Filled 2019-12-19: qty 1

## 2019-12-19 MED ORDER — ACETAMINOPHEN 500 MG PO TABS
1000.0000 mg | ORAL_TABLET | ORAL | Status: AC
  Administered 2019-12-19: 1000 mg via ORAL

## 2019-12-19 MED ORDER — FENTANYL CITRATE (PF) 100 MCG/2ML IJ SOLN
INTRAMUSCULAR | Status: AC
  Filled 2019-12-19: qty 2

## 2019-12-19 MED ORDER — CEFAZOLIN SODIUM-DEXTROSE 2-4 GM/100ML-% IV SOLN: 2.0000 g | INTRAVENOUS | Status: DC

## 2019-12-19 MED ORDER — HYDROCODONE-ACETAMINOPHEN 5-325 MG PO TABS: 1.0000 | ORAL_TABLET | Freq: Four times a day (QID) | ORAL | 0 refills | Status: DC | PRN

## 2019-12-19 MED ORDER — ACETAMINOPHEN 160 MG/5ML PO SOLN: 325.0000 mg | Freq: Once | ORAL | Status: DC | PRN

## 2019-12-19 SURGICAL SUPPLY — 40 items
APPLIER CLIP 9.375 MED OPEN (MISCELLANEOUS)
BLADE SURG 15 STRL LF DISP TIS (BLADE) ×1 IMPLANT
BLADE SURG 15 STRL SS (BLADE) ×1
CANISTER SUC SOCK COL 7IN (MISCELLANEOUS) ×2 IMPLANT
CANISTER SUCT 1200ML W/VALVE (MISCELLANEOUS) ×2 IMPLANT
CHLORAPREP W/TINT 26 (MISCELLANEOUS) ×2 IMPLANT
CLIP APPLIE 9.375 MED OPEN (MISCELLANEOUS) IMPLANT
COVER BACK TABLE 60X90IN (DRAPES) ×2 IMPLANT
COVER MAYO STAND STRL (DRAPES) ×2 IMPLANT
COVER PROBE W GEL 5X96 (DRAPES) ×2 IMPLANT
COVER WAND RF STERILE (DRAPES) IMPLANT
DERMABOND ADVANCED (GAUZE/BANDAGES/DRESSINGS) ×1
DERMABOND ADVANCED .7 DNX12 (GAUZE/BANDAGES/DRESSINGS) ×1 IMPLANT
DRAPE LAPAROSCOPIC ABDOMINAL (DRAPES) ×2 IMPLANT
DRAPE UTILITY XL STRL (DRAPES) ×2 IMPLANT
ELECT COATED BLADE 2.86 ST (ELECTRODE) ×2 IMPLANT
ELECT REM PT RETURN 9FT ADLT (ELECTROSURGICAL) ×2
ELECTRODE REM PT RTRN 9FT ADLT (ELECTROSURGICAL) ×1 IMPLANT
GLOVE BIO SURGEON STRL SZ7.5 (GLOVE) ×4 IMPLANT
GLOVE BIOGEL PI IND STRL 7.0 (GLOVE) IMPLANT
GLOVE BIOGEL PI INDICATOR 7.0 (GLOVE) ×1
GLOVE SURG SS PI 6.5 STRL IVOR (GLOVE) ×1 IMPLANT
GOWN STRL REUS W/ TWL LRG LVL3 (GOWN DISPOSABLE) ×2 IMPLANT
GOWN STRL REUS W/TWL LRG LVL3 (GOWN DISPOSABLE) ×2
KIT MARKER MARGIN INK (KITS) ×2 IMPLANT
NDL HYPO 25X1 1.5 SAFETY (NEEDLE) IMPLANT
NEEDLE HYPO 25X1 1.5 SAFETY (NEEDLE) ×2 IMPLANT
NS IRRIG 1000ML POUR BTL (IV SOLUTION) ×1 IMPLANT
PACK BASIN DAY SURGERY FS (CUSTOM PROCEDURE TRAY) ×2 IMPLANT
PENCIL SMOKE EVACUATOR (MISCELLANEOUS) ×2 IMPLANT
SLEEVE SCD COMPRESS KNEE MED (MISCELLANEOUS) ×2 IMPLANT
SPONGE LAP 18X18 RF (DISPOSABLE) ×2 IMPLANT
SUT MON AB 4-0 PC3 18 (SUTURE) ×2 IMPLANT
SUT SILK 2 0 SH (SUTURE) IMPLANT
SUT VICRYL 3-0 CR8 SH (SUTURE) ×2 IMPLANT
SYR CONTROL 10ML LL (SYRINGE) ×1 IMPLANT
TOWEL GREEN STERILE FF (TOWEL DISPOSABLE) ×2 IMPLANT
TRAY FAXITRON CT DISP (TRAY / TRAY PROCEDURE) ×2 IMPLANT
TUBE CONNECTING 20X1/4 (TUBING) ×2 IMPLANT
YANKAUER SUCT BULB TIP NO VENT (SUCTIONS) IMPLANT

## 2019-12-19 NOTE — Interval H&P Note (Signed)
History and Physical Interval Note:  12/19/2019 9:54 AM  Desiree Lee  has presented today for surgery, with the diagnosis of left breast radial scar.  The various methods of treatment have been discussed with the patient and family. After consideration of risks, benefits and other options for treatment, the patient has consented to  Procedure(s): LEFT BREAST LUMPECTOMY WITH RADIOACTIVE SEED LOCALIZATION (Left) as a surgical intervention.  The patient's history has been reviewed, patient examined, no change in status, stable for surgery.  I have reviewed the patient's chart and labs.  Questions were answered to the patient's satisfaction.     Autumn Messing III

## 2019-12-19 NOTE — Op Note (Signed)
12/19/2019  10:47 AM  PATIENT:  Desiree Lee  44 y.o. female  PRE-OPERATIVE DIAGNOSIS:  left breast radial scar  POST-OPERATIVE DIAGNOSIS:  left breast radial scar  PROCEDURE:  Procedure(s): LEFT BREAST LUMPECTOMY WITH RADIOACTIVE SEED LOCALIZATION (Left)  SURGEON:  Surgeon(s) and Role:    Jovita Kussmaul, MD - Primary  PHYSICIAN ASSISTANT:   ASSISTANTS: none   ANESTHESIA:   local and general  EBL:  Minimal   BLOOD ADMINISTERED:none  DRAINS: none   LOCAL MEDICATIONS USED:  MARCAINE     SPECIMEN:  Source of Specimen:  left breast tissue  DISPOSITION OF SPECIMEN:  PATHOLOGY  COUNTS:  YES  TOURNIQUET:  * No tourniquets in log *  DICTATION: .Dragon Dictation   After informed consent was obtained the patient was brought to the operating room and placed in the supine position on the operating table.  After adequate induction of general anesthesia the patient's left breast was prepped with ChloraPrep, allowed to dry, and draped in usual sterile manner.  An appropriate timeout was performed.  Previously an I-125 seed was placed in the lower inner quadrant of the left breast to mark an area of radial scar.  The neoprobe was set to I-125 in the area of radioactivity was readily identified.  The area around this was infiltrated with quarter percent Marcaine.  A curvilinear incision was made along the lower inner edge of the areola of the left breast with a 15 blade knife.  The incision was carried through the skin and subcutaneous tissue sharply with the electrocautery.  Dissection was then carried out sharply with the electrocautery towards the radioactive seed under the direction of the neoprobe.  Once I more closely approached the radioactive seed I then removed a circular portion of breast tissue sharply with the electrocautery around the radioactive seed while checking the area of radioactivity frequently.  Once the specimen was removed it was oriented with the appropriate paint  colors.  A specimen radiograph was obtained that showed the clip and seed to be within the specimen.  The specimen was then sent to pathology for further evaluation.  Hemostasis was achieved using the Bovie electrocautery.  The wound was infiltrated with more quarter percent Marcaine and irrigated with saline.  The deep layer of the wound was then closed with layers of interrupted 3-0 Vicryl stitches.  The skin was then closed with interrupted 4-0 Monocryl subcuticular stitches.  Dermabond dressings were applied.  The patient tolerated the procedure well.  At the end of the case all needle sponge and instrument counts were correct.  The patient was then awakened and taken to recovery in stable condition.  PLAN OF CARE: Discharge to home after PACU  PATIENT DISPOSITION:  PACU - hemodynamically stable.   Delay start of Pharmacological VTE agent (>24hrs) due to surgical blood loss or risk of bleeding: not applicable

## 2019-12-19 NOTE — Anesthesia Procedure Notes (Signed)
Procedure Name: LMA Insertion Date/Time: 12/19/2019 10:11 AM Performed by: Myna Bright, CRNA Pre-anesthesia Checklist: Patient identified, Emergency Drugs available, Suction available and Patient being monitored Patient Re-evaluated:Patient Re-evaluated prior to induction Oxygen Delivery Method: Circle system utilized Preoxygenation: Pre-oxygenation with 100% oxygen Induction Type: IV induction Ventilation: Mask ventilation without difficulty LMA: LMA inserted LMA Size: 4.0 Number of attempts: 1 Placement Confirmation: positive ETCO2 and breath sounds checked- equal and bilateral Tube secured with: Tape Dental Injury: Teeth and Oropharynx as per pre-operative assessment

## 2019-12-19 NOTE — Anesthesia Postprocedure Evaluation (Signed)
Anesthesia Post Note  Patient: Desiree Lee  Procedure(s) Performed: LEFT BREAST LUMPECTOMY WITH RADIOACTIVE SEED LOCALIZATION (Left Breast)     Patient location during evaluation: PACU Anesthesia Type: General Level of consciousness: awake and alert Pain management: pain level controlled Vital Signs Assessment: post-procedure vital signs reviewed and stable Respiratory status: spontaneous breathing, nonlabored ventilation, respiratory function stable and patient connected to nasal cannula oxygen Cardiovascular status: blood pressure returned to baseline and stable Postop Assessment: no apparent nausea or vomiting Anesthetic complications: no    Last Vitals:  Vitals:   12/19/19 1115 12/19/19 1200  BP: 109/71 111/72  Pulse: 95 86  Resp: (!) 22 16  Temp:  36.7 C  SpO2: 100% 100%    Last Pain:  Vitals:   12/19/19 1200  TempSrc:   PainSc: 4                  Chrislynn Mosely L Chelcie Estorga

## 2019-12-19 NOTE — Transfer of Care (Signed)
Immediate Anesthesia Transfer of Care Note  Patient: Desiree Lee  Procedure(s) Performed: LEFT BREAST LUMPECTOMY WITH RADIOACTIVE SEED LOCALIZATION (Left Breast)  Patient Location: PACU  Anesthesia Type:General  Level of Consciousness: awake, alert , oriented and patient cooperative  Airway & Oxygen Therapy: Patient Spontanous Breathing and Patient connected to face mask oxygen  Post-op Assessment: Report given to RN, Post -op Vital signs reviewed and stable and Patient moving all extremities  Post vital signs: Reviewed and stable  Last Vitals:  Vitals Value Taken Time  BP 120/73 12/19/19 1053  Temp    Pulse 100 12/19/19 1055  Resp 0 12/19/19 1055  SpO2 100 % 12/19/19 1055  Vitals shown include unvalidated device data.  Last Pain:  Vitals:   12/19/19 0944  TempSrc: Temporal  PainSc: 0-No pain      Patients Stated Pain Goal: 1 ([id number])  Complications: No apparent anesthesia complications

## 2019-12-19 NOTE — Anesthesia Preprocedure Evaluation (Signed)
Anesthesia Evaluation  Patient identified by MRN, date of birth, ID band Patient awake    Reviewed: Allergy & Precautions, NPO status , Patient's Chart, lab work & pertinent test results  History of Anesthesia Complications (+) PONV and history of anesthetic complications  Airway Mallampati: I  TM Distance: >3 FB Neck ROM: Full    Dental  (+) Teeth Intact, Dental Advisory Given   Pulmonary former smoker,    breath sounds clear to auscultation       Cardiovascular negative cardio ROS   Rhythm:Regular Rate:Normal     Neuro/Psych  Headaches, negative psych ROS   GI/Hepatic Neg liver ROS, GERD  ,  Endo/Other  negative endocrine ROS  Renal/GU negative Renal ROS     Musculoskeletal negative musculoskeletal ROS (+)   Abdominal Normal abdominal exam  (+)   Peds  Hematology negative hematology ROS (+)   Anesthesia Other Findings   Reproductive/Obstetrics                             Anesthesia Physical Anesthesia Plan  ASA: II  Anesthesia Plan: General   Post-op Pain Management:    Induction: Intravenous  PONV Risk Score and Plan: 4 or greater and Ondansetron, Dexamethasone, Midazolam and Scopolamine patch - Pre-op  Airway Management Planned: LMA  Additional Equipment: None  Intra-op Plan:   Post-operative Plan: Extubation in OR  Informed Consent: I have reviewed the patients History and Physical, chart, labs and discussed the procedure including the risks, benefits and alternatives for the proposed anesthesia with the patient or authorized representative who has indicated his/her understanding and acceptance.     Dental advisory given  Plan Discussed with: CRNA  Anesthesia Plan Comments:         Anesthesia Quick Evaluation

## 2019-12-19 NOTE — Discharge Instructions (Signed)
No Tylenol before 3:45PM today.  Post Anesthesia Home Care Instructions  Activity: Get plenty of rest for the remainder of the day. A responsible individual must stay with you for 24 hours following the procedure.  For the next 24 hours, DO NOT: -Drive a car -Paediatric nurse -Drink alcoholic beverages -Take any medication unless instructed by your physician -Make any legal decisions or sign important papers.  Meals: Start with liquid foods such as gelatin or soup. Progress to regular foods as tolerated. Avoid greasy, spicy, heavy foods. If nausea and/or vomiting occur, drink only clear liquids until the nausea and/or vomiting subsides. Call your physician if vomiting continues.  Special Instructions/Symptoms: Your throat may feel dry or sore from the anesthesia or the breathing tube placed in your throat during surgery. If this causes discomfort, gargle with warm salt water. The discomfort should disappear within 24 hours.  If you had a scopolamine patch placed behind your ear for the management of post- operative nausea and/or vomiting:  1. The medication in the patch is effective for 72 hours, after which it should be removed.  Wrap patch in a tissue and discard in the trash. Wash hands thoroughly with soap and water. 2. You may remove the patch earlier than 72 hours if you experience unpleasant side effects which may include dry mouth, dizziness or visual disturbances. 3. Avoid touching the patch. Wash your hands with soap and water after contact with the patch.

## 2019-12-19 NOTE — H&P (Signed)
Desiree Lee  Location: [facility] Patient #: [id number] DOB: 02-22-1976 Married / Language: English / Race: White Female   History of Present Illness  The patient is a 44 year old female who presents with a breast mass. we are asked to see the patient in consultation by Dr. [place] Pink to evaluate her for a radial scar of the left breast. The patient is a 44 year old white female who recently went for a routine screening mammogram. At that time she was found to have a 5 mm mass in the lower inner quadrant of the left breast. The area looked suspicious and was biopsied. The pathology came back as a radial scar. She denies any breast pain or discharge from the nipple. She quit smoking about 15 years ago. She does have a family history of breast cancer and a maternal aunt and what is thought to be possible ovarian cancer in a maternal grandmother.   Past Surgical History  Breast Biopsy  Left. Oral Surgery   Diagnostic Studies History  Colonoscopy  never Mammogram  within last year Pap Smear  >5 years ago  Allergies  Bactrim *ANTI-INFECTIVE AGENTS - MISC.*   Medication History  ZyrTEC Allergy (10MG  Tablet, Oral) Active. Melatonin (Oral prn) Specific strength unknown - Active. Medications Reconciled  Social History  Alcohol use  Remotely quit alcohol use. Caffeine use  Carbonated beverages, Coffee, Tea. No drug use  Tobacco use  Former smoker.  Family History  Alcohol Abuse  Father. Arthritis  Mother. Hypertension  Brother, Father. Melanoma  Mother. Respiratory Condition  Father.  Pregnancy / Birth History  Age at menarche  15 years. Contraceptive History  Oral contraceptives. Gravida  5 Length (months) of breastfeeding  7-12 Maternal age  46-35 Para  3 Regular periods   Other Problems  Gastroesophageal Reflux Disease  Migraine Headache     Review of Systems  General Not Present- Appetite Loss, Chills, Fatigue, Fever,  Night Sweats, Weight Gain and Weight Loss. HEENT Present- Seasonal Allergies and Wears glasses/contact lenses. Not Present- Earache, Hearing Loss, Hoarseness, Nose Bleed, Oral Ulcers, Ringing in the Ears, Sinus Pain, Sore Throat, Visual Disturbances and Yellow Eyes. Respiratory Not Present- Bloody sputum, Chronic Cough, Difficulty Breathing, Snoring and Wheezing. Breast Not Present- Breast Mass, Breast Pain, Nipple Discharge and Skin Changes. Cardiovascular Not Present- Chest Pain, Difficulty Breathing Lying Down, Leg Cramps, Palpitations, Rapid Heart Rate, Shortness of Breath and Swelling of Extremities. Gastrointestinal Not Present- Abdominal Pain, Bloating, Bloody Stool, Change in Bowel Habits, Chronic diarrhea, Constipation, Difficulty Swallowing, Excessive gas, Gets full quickly at meals, Hemorrhoids, Indigestion, Nausea, Rectal Pain and Vomiting. Female Genitourinary Not Present- Frequency, Nocturia, Painful Urination, Pelvic Pain and Urgency. Musculoskeletal Not Present- Back Pain, Joint Pain, Joint Stiffness, Muscle Pain, Muscle Weakness and Swelling of Extremities. Neurological Not Present- Decreased Memory, Fainting, Headaches, Numbness, Seizures, Tingling, Tremor, Trouble walking and Weakness. Psychiatric Not Present- Anxiety, Bipolar, Change in Sleep Pattern, Depression, Fearful and Frequent crying. Endocrine Not Present- Cold Intolerance, Excessive Hunger, Hair Changes, Heat Intolerance, Hot flashes and New Diabetes.  Vitals Weight: 186 lb Height: 66in Body Surface Area: 1.94 m Body Mass Index: 30.02 kg/m  Temp.: 98.21F(Oral)  Pulse: 123 (Regular)        Physical Exam  General Mental Status-Alert. General Appearance-Consistent with stated age. Hydration-Well hydrated. Voice-Normal.  Head and Neck Head-normocephalic, atraumatic with no lesions or palpable masses. Trachea-midline. Thyroid Gland Characteristics - normal size and  consistency.  Eye Eyeball - Bilateral-Extraocular movements intact. Sclera/Conjunctiva - Bilateral-No scleral icterus.  Chest and Lung Exam Chest and lung exam reveals -quiet, even and easy respiratory effort with no use of accessory muscles and on auscultation, normal breath sounds, no adventitious sounds and normal vocal resonance. Inspection Chest Wall - Normal. Back - normal.  Breast Note: there is no palpable mass in either breast. There is no palpable axillary, supra clavicular, or cervical lymphadenopathy.   Cardiovascular Cardiovascular examination reveals -normal heart sounds, regular rate and rhythm with no murmurs and normal pedal pulses bilaterally.  Abdomen Inspection Inspection of the abdomen reveals - No Hernias. Skin - Scar - no surgical scars. Palpation/Percussion Palpation and Percussion of the abdomen reveal - Soft, Non Tender, No Rebound tenderness, No Rigidity (guarding) and No hepatosplenomegaly. Auscultation Auscultation of the abdomen reveals - Bowel sounds normal.  Neurologic Neurologic evaluation reveals -alert and oriented x 3 with no impairment of recent or remote memory. Mental Status-Normal.  Musculoskeletal Normal Exam - Left-Upper Extremity Strength Normal and Lower Extremity Strength Normal. Normal Exam - Right-Upper Extremity Strength Normal and Lower Extremity Strength Normal.  Lymphatic Head & Neck  General Head & Neck Lymphatics: Bilateral - Description - Normal. Axillary  General Axillary Region: Bilateral - Description - Normal. Tenderness - Non Tender. Femoral & Inguinal  Generalized Femoral & Inguinal Lymphatics: Bilateral - Description - Normal. Tenderness - Non Tender.    Assessment & Plan  RADIAL SCAR OF LEFT BREAST (N64.89) Impression: the patient appears to have a radial scar in the lower inner quadrant of the left breast forming a 5 mm mass. Because of the presence of the mass and because of its abnormal  appearance on mammogram and because there is an approximate 5-10% chance of missing something more significant the recommendation is to have this area removed. With her family history of breast cancer she would also like to have this done. I have discussed with her in detail the risks and benefits of the operation as well as some of the technical aspects including the use of a wire or seed for localization and she understands and wishes to proceed.

## 2019-12-20 ENCOUNTER — Encounter: Payer: Self-pay | Admitting: *Deleted

## 2019-12-20 LAB — SURGICAL PATHOLOGY

## 2020-01-15 ENCOUNTER — Telehealth: Payer: Self-pay | Admitting: Hematology and Oncology

## 2020-01-15 NOTE — Telephone Encounter (Signed)
Received a new pt referral from Dr. Marlou Starks for Desiree Lee to be seen in the high risk breast clinic. Desiree Lee has been cld and scheduled to see Dr. Lindi Adie on 4/26 at 230pm. Pt aware to arrive 15 minutes early.

## 2020-01-16 ENCOUNTER — Other Ambulatory Visit: Payer: Self-pay | Admitting: Obstetrics & Gynecology

## 2020-01-16 ENCOUNTER — Other Ambulatory Visit (HOSPITAL_COMMUNITY)
Admission: RE | Admit: 2020-01-16 | Discharge: 2020-01-16 | Disposition: A | Discharge status: Home / Self Care | Payer: 59 | Source: Ambulatory Visit | Attending: Obstetrics & Gynecology | Admitting: Obstetrics & Gynecology

## 2020-01-16 ENCOUNTER — Encounter: Payer: Self-pay | Admitting: Obstetrics & Gynecology

## 2020-01-16 ENCOUNTER — Ambulatory Visit (INDEPENDENT_AMBULATORY_CARE_PROVIDER_SITE_OTHER): Payer: 59 | Admitting: Obstetrics & Gynecology

## 2020-01-16 ENCOUNTER — Other Ambulatory Visit: Payer: Self-pay

## 2020-01-16 VITALS — BP 120/70 | Ht 66.0 in | Wt 191.0 lb

## 2020-01-16 DIAGNOSIS — Z01419 Encounter for gynecological examination (general) (routine) without abnormal findings: Secondary | ICD-10-CM

## 2020-01-16 DIAGNOSIS — Z124 Encounter for screening for malignant neoplasm of cervix: Secondary | ICD-10-CM | POA: Diagnosis not present

## 2020-01-16 NOTE — Progress Notes (Signed)
HPI:      [name] is a 44 y.o. G3P3 WF who LMP was Patient's last menstrual period was 01/08/2020., she presents today for her annual examination. The patient has no complaints today other than contimued prolonged and painful cycles (reg, 6d w 3d heavy, painful one week prior and then during, known endomtriosis). The patient is sexually active. Her last pap: approximate date years ago and was normal and last mammogram: approximate date 11/2019 and was abnormal: has had surgery for radial scar left breast by Dr Desiree Lee 11/2019. The patient does perform self breast exams.  There is notable family history of breast or ovarian cancer in her family.  The patient has regular exercise: yes.  The patient denies current symptoms of depression.    GYN History: Contraception: tubal ligation  PMHx: Past Medical History:  Diagnosis Date  . Chronic sinusitis   . Deviated nasal septum   . GERD (gastroesophageal reflux disease)   . Headache    sinus  . Motion sickness    reading in cars  . PONV (postoperative nausea and vomiting)    also, low BP, wakes up slow   Past Surgical History:  Procedure Laterality Date  . BREAST BIOPSY Left 10/09/2019   affirm bx, x marker, path pending  . BREAST LUMPECTOMY WITH RADIOACTIVE SEED LOCALIZATION Left 12/19/2019   Procedure: LEFT BREAST LUMPECTOMY WITH RADIOACTIVE SEED LOCALIZATION;  Surgeon: Desiree Kussmaul, MD;  Location: [facility];  Service: General;  Laterality: Left;  . IMAGE GUIDED SINUS SURGERY N/A 01/29/2016   Procedure: IMAGE GUIDED SINUS SURGERY;  Surgeon: Desiree Gust, MD;  Location: [facility];  Service: ENT;  Laterality: N/A;  . LAPAROSCOPY  2009   for endometriosis  . MAXILLARY ANTROSTOMY Bilateral 01/29/2016   Procedure: ENDOSCOPIC MAXILLARY ANTROSTOMY WITH TISSUE;  Surgeon: Desiree Gust, MD;  Location: [facility];  Service: ENT;  Laterality: Bilateral;  . NASAL TURBINATE REDUCTION Bilateral 01/29/2016   Procedure: TURBINATE REDUCTION/SUBMUCOSAL RESECTION and bilateral SMR middle turbinates.;  Surgeon: Desiree Gust, MD;  Location: [facility];  Service: ENT;  Laterality: Bilateral;  . OTOPLASTY  1983  . SEPTOPLASTY N/A 01/29/2016   Procedure: SEPTOPLASTY;  Surgeon: Desiree Gust, MD;  Location: [facility];  Service: ENT;  Laterality: N/A;  DISK GAVE DISK TO Desiree Lee 4-06 KP  . TUBAL LIGATION  01/21/14   Family History  Problem Relation Age of Onset  . Breast cancer Maternal Aunt 55   Social History   Tobacco Use  . Smoking status: Former Smoker    Quit date: 09/30/2005    Years since quitting: 14.3  . Smokeless tobacco: Never Used  Substance Use Topics  . Alcohol use: No  . Drug use: Not on file    Current Outpatient Medications:  .  levocetirizine (XYZAL) 5 MG tablet, Take 5 mg by mouth every evening., Disp: , Rfl:  .  cetirizine (ZYRTEC) 10 MG tablet, Take 10 mg by mouth daily., Disp: , Rfl:  .  fluticasone (FLONASE) 50 MCG/ACT nasal spray, Place into both nostrils daily as needed for allergies or rhinitis., Disp: , Rfl:  Allergies: Bactrim [sulfamethoxazole-trimethoprim]  Review of Systems  Constitutional: Negative for chills, fever and malaise/fatigue.  HENT: Negative for congestion, sinus pain and sore throat.   Eyes: Negative for blurred vision and pain.  Respiratory: Negative for cough and wheezing.   Cardiovascular: Negative for chest pain and leg swelling.  Gastrointestinal: Positive for abdominal pain. Negative for constipation, diarrhea, heartburn,  nausea and vomiting.  Genitourinary: Negative for dysuria, frequency, hematuria and urgency.  Musculoskeletal: Negative for back pain, joint pain, myalgias and neck pain.  Skin: Negative for itching and rash.  Neurological: Negative for dizziness, tremors and weakness.  Endo/Heme/Allergies: Does not bruise/bleed easily.  Psychiatric/Behavioral: Negative for depression. The patient is not nervous/anxious  and does not have insomnia.     Objective: BP 120/70   Ht 5\' 6"  (1.676 m)   Wt 191 lb (86.6 kg)   LMP 01/08/2020   BMI 30.83 kg/m   Filed Weights   01/16/20 0857  Weight: 191 lb (86.6 kg)   Body mass index is 30.83 kg/m. Physical Exam Constitutional:      General: She is not in acute distress.    Appearance: She is well-developed.  Genitourinary:     Pelvic exam was performed with patient supine.     Vagina, uterus and rectum normal.     No lesions in the vagina.     No vaginal bleeding.     No cervical motion tenderness, friability, lesion or polyp.     Uterus is mobile.     Uterus is not enlarged.     No uterine mass detected.    Uterus is midaxial.     No right or left adnexal mass present.     Right adnexa not tender.     Left adnexa not tender.  HENT:     Head: Normocephalic and atraumatic. No laceration.     Right Ear: Hearing normal.     Left Ear: Hearing normal.     Mouth/Throat:     Pharynx: Uvula midline.  Eyes:     Pupils: Pupils are equal, round, and reactive to light.  Neck:     Thyroid: No thyromegaly.  Cardiovascular:     Rate and Rhythm: Normal rate and regular rhythm.     Heart sounds: No murmur. No friction rub. No gallop.   Pulmonary:     Effort: Pulmonary effort is normal. No respiratory distress.     Breath sounds: Normal breath sounds. No wheezing.  Chest:     Breasts:        Right: No mass, skin change or tenderness.        Left: No mass, skin change or tenderness.  Abdominal:     General: Bowel sounds are normal. There is no distension.     Palpations: Abdomen is soft.     Tenderness: There is no abdominal tenderness. There is no rebound.  Musculoskeletal:        General: Normal range of motion.     Cervical back: Normal range of motion and neck supple.  Neurological:     Mental Status: She is alert and oriented to person, place, and time.     Cranial Nerves: No cranial nerve deficit.  Skin:    General: Skin is warm and dry.    Psychiatric:        Judgment: Judgment normal.  Vitals reviewed.     Assessment:  ANNUAL EXAM 1. Women's annual routine gynecological examination   2. Screening for cervical cancer      Screening Plan:            1.  Cervical Screening-  Pap smear done today  2. Breast screening- Exam annually and mammogram>40 planned   3. Colonoscopy every 10 years, Hemoccult testing - after age 12  4. Labs managed by PCP  5. Counseling for contraception: She has had BTL done in  past   6. Endometriosis and she had dysmenorrhea and menorrhagia w reg cycle  Options of meds, surgery (hysterectomy) discussed and info provided to pt today    F/U  Return in about 1 year (around 01/15/2021) for Annual.  Barnett Applebaum, MD, [facility], [facility] 01/16/2020  9:30 AM

## 2020-01-16 NOTE — Telephone Encounter (Signed)
Surgery Booking Request Patient Full Name:  Desiree Lee  MRN: [id number]  DOB: 12-13-1975  Surgeon: Hoyt Koch, MD  Requested Surgery Date and Time: Any Primary Diagnosis AND Code: Pelvic pain, Dysmenorrhea, Post Ablation Syndrome Secondary Diagnosis and Code: N80.9, R10.2 Surgical Procedure: TLH/BS L&D Notification: No Admission Status: same day surgery Length of Surgery: 1 hr Special Case Needs: No H&P: Yes Phone Interview???:  Yes Interpreter: No Language:  Medical Clearance:  No Special Scheduling Instructions: no Any known health/anesthesia issues, diabetes, sleep apnea, latex allergy, defibrillator/pacemaker?: No Acuity: P3   (P1 highest, P2 delay may cause harm, P3 low, elective gyn, P4 lowest)

## 2020-01-16 NOTE — Patient Instructions (Signed)
PAP every three years Mammogram every year Labs yearly (with PCP) 

## 2020-01-17 LAB — CYTOLOGY - PAP
Comment: NEGATIVE
Diagnosis: UNDETERMINED — AB
High risk HPV: NEGATIVE

## 2020-01-19 NOTE — Progress Notes (Signed)
[facility] CONSULT NOTE  Patient Care Team: Maryland Pink, MD as PCP - General (Family Medicine)  CHIEF COMPLAINTS/PURPOSE OF CONSULTATION:  Newly diagnosed high risk for breast cancer  HISTORY OF PRESENTING ILLNESS:  Desiree Lee 44 y.o. female is here because of recent diagnosis of high risk for breast cancer. Mammogram on 09/23/19 showed a possible upper right breast mass and a possible inner left breast distortion. Mammogram and [place] on 10/02/19 showed two right breast cysts, and several left breast cysts with a suspicious distortion in the lower-inner quadrant. Biopsy on 10/09/19 showed radial scar with usual ductal hyperplasia, negative for malignancy. She underwent a lumpectomy on 12/19/19 with Dr. Marlou Starks for which pathology showed a complex sclerosing lesion, negative for malignancy. She presents to the clinic today for initial evaluation and discussion of surveillance options.   I reviewed her records extensively and collaborated the history with the patient.  SUMMARY OF ONCOLOGIC HISTORY: Oncology History   No history exists.    MEDICAL HISTORY:  Past Medical History:  Diagnosis Date  . Chronic sinusitis   . Deviated nasal septum   . GERD (gastroesophageal reflux disease)   . Headache    sinus  . Motion sickness    reading in cars  . PONV (postoperative nausea and vomiting)    also, low BP, wakes up slow    SURGICAL HISTORY: Past Surgical History:  Procedure Laterality Date  . BREAST BIOPSY Left 10/09/2019   affirm bx, x marker, path pending  . BREAST LUMPECTOMY WITH RADIOACTIVE SEED LOCALIZATION Left 12/19/2019   Procedure: LEFT BREAST LUMPECTOMY WITH RADIOACTIVE SEED LOCALIZATION;  Surgeon: Jovita Kussmaul, MD;  Location: [facility];  Service: General;  Laterality: Left;  . IMAGE GUIDED SINUS SURGERY N/A 01/29/2016   Procedure: IMAGE GUIDED SINUS SURGERY;  Surgeon: Beverly Gust, MD;  Location: [facility];  Service: ENT;   Laterality: N/A;  . LAPAROSCOPY  2009   for endometriosis  . MAXILLARY ANTROSTOMY Bilateral 01/29/2016   Procedure: ENDOSCOPIC MAXILLARY ANTROSTOMY WITH TISSUE;  Surgeon: Beverly Gust, MD;  Location: [facility];  Service: ENT;  Laterality: Bilateral;  . NASAL TURBINATE REDUCTION Bilateral 01/29/2016   Procedure: TURBINATE REDUCTION/SUBMUCOSAL RESECTION and bilateral SMR middle turbinates.;  Surgeon: Beverly Gust, MD;  Location: [facility];  Service: ENT;  Laterality: Bilateral;  . OTOPLASTY  1983  . SEPTOPLASTY N/A 01/29/2016   Procedure: SEPTOPLASTY;  Surgeon: Beverly Gust, MD;  Location: [facility];  Service: ENT;  Laterality: N/A;  DISK GAVE DISK TO TABITHA 4-06 KP  . TUBAL LIGATION  01/21/14    SOCIAL HISTORY: Social History   Socioeconomic History  . Marital status: Married    Spouse name: Not on file  . Number of children: Not on file  . Years of education: Not on file  . Highest education level: Not on file  Occupational History  . Not on file  Tobacco Use  . Smoking status: Former Smoker    Quit date: 09/30/2005    Years since quitting: 14.3  . Smokeless tobacco: Never Used  Substance and Sexual Activity  . Alcohol use: No  . Drug use: Not on file  . Sexual activity: Not on file  Other Topics Concern  . Not on file  Social History Narrative  . Not on file   Social Determinants of Health   Financial Resource Strain:   . Difficulty of Paying Living Expenses:   Food Insecurity:   . Worried About Estate manager/land agent  of Food in the Last Year:   . [place] in the Last Year:   Transportation Needs:   . Lack of Transportation (Medical):   Marland Kitchen Lack of Transportation (Non-Medical):   Physical Activity:   . Days of Exercise per Week:   . Minutes of Exercise per Session:   Stress:   . Feeling of Stress :   Social Connections:   . Frequency of Communication with Friends and Family:   . Frequency of Social Gatherings with Friends and Family:     . Attends Religious Services:   . Active Member of Clubs or Organizations:   . Attends Archivist Meetings:   Marland Kitchen Marital Status:   Intimate Partner Violence:   . Fear of Current or Ex-Partner:   . Emotionally Abused:   Marland Kitchen Physically Abused:   . Sexually Abused:     FAMILY HISTORY: Family History  Problem Relation Age of Onset  . Breast cancer Maternal Aunt 40    ALLERGIES:  is allergic to bactrim [sulfamethoxazole-trimethoprim].  MEDICATIONS:  Current Outpatient Medications  Medication Sig Dispense Refill  . cetirizine (ZYRTEC) 10 MG tablet Take 10 mg by mouth daily.    . fluticasone (FLONASE) 50 MCG/ACT nasal spray Place into both nostrils daily as needed for allergies or rhinitis.    Marland Kitchen levocetirizine (XYZAL) 5 MG tablet Take 5 mg by mouth every evening.     No current facility-administered medications for this visit.    REVIEW OF SYSTEMS:   Constitutional: Denies fevers, chills or abnormal night sweats Eyes: Denies blurriness of vision, double vision or watery eyes Ears, nose, mouth, throat, and face: Denies mucositis or sore throat Respiratory: Denies cough, dyspnea or wheezes Cardiovascular: Denies palpitation, chest discomfort or lower extremity swelling Gastrointestinal:  Denies nausea, heartburn or change in bowel habits Skin: Denies abnormal skin rashes Lymphatics: Denies new lymphadenopathy or easy bruising Neurological:Denies numbness, tingling or new weaknesses Behavioral/Psych: Mood is stable, no new changes  Breast: s/p left lumpectomy  All other systems were reviewed with the patient and are negative.  PHYSICAL EXAMINATION: ECOG PERFORMANCE STATUS: 1 - Symptomatic but completely ambulatory  Vitals:   01/20/20 1443  BP: 120/79  Pulse: 93  Resp: 18  Temp: 98.5 F (36.9 C)  SpO2: 100%   Filed Weights   01/20/20 1443  Weight: 190 lb 14.4 oz (86.6 kg)    GENERAL:alert, no distress and comfortable SKIN: skin color, texture, turgor are  normal, no rashes or significant lesions EYES: normal, conjunctiva are pink and non-injected, sclera clear OROPHARYNX:no exudate, no erythema and lips, buccal mucosa, and tongue normal  NECK: supple, thyroid normal size, non-tender, without nodularity LYMPH:  no palpable lymphadenopathy in the cervical, axillary or inguinal LUNGS: clear to auscultation and percussion with normal breathing effort HEART: regular rate & rhythm and no murmurs and no lower extremity edema ABDOMEN:abdomen soft, non-tender and normal bowel sounds Musculoskeletal:no cyanosis of digits and no clubbing  PSYCH: alert & oriented x 3 with fluent speech NEURO: no focal motor/sensory deficits    LABORATORY DATA:  I have reviewed the data as listed Lab Results  Component Value Date   WBC 5.3 01/15/2014   HGB 14.1 01/15/2014   HCT 40.8 01/15/2014   MCV 87 01/15/2014   PLT 180 01/15/2014   No results found for: NA, K, CL, CO2  RADIOGRAPHIC STUDIES: I have personally reviewed the radiological reports and agreed with the findings in the report.  ASSESSMENT AND PLAN:  Radial scar of  left breast Mammogram and [place] on 10/02/19 showed two right breast cysts, and several left breast cysts with a suspicious distortion in the lower-inner quadrant. Biopsy on 10/09/19 showed radial scar with usual ductal hyperplasia. Left lumpectomy 12/19/2019: Complex sclerosing lesion (radial scar) with adenosis and apocrine metaplasia.  Negative for carcinoma.  Pathology counseling: Radial scars, also called complex sclerosing lesions, are a pathologic diagnosis, usually discovered incidentally when a breast mass or radiologic abnormality is removed or biopsied. Occasionally, radial scars are large enough to be detected on mammography as suspicious spiculated masses, which cannot be reliably differentiated from spiculated carcinoma by imaging alone. radial scars are excised because most case series show that 8 to 17 percent of surgical specimens at  subsequent excision are positive for malignancy or DCIS.  Risk assessment: Tyrer Cusick: 10-year risk: 3.7% (average woman's risk 2%) Lifetime risk: 18.5% (average woman's risk 10.5%)  We discussed different pharmacological and nonpharmacological measures of breast reduction. We did not think she would benefit from tamoxifen therapy.  Breast cancer surveillance: Annual mammograms. No role of breast MRI.  We discussed that exercise would reduce her risk of breast cancer along with eating healthy and we discussed different diets that can reduce her breast cancer risk.  We also talked about supplements and the role of vitamin D.  She is already taking 50,000 units of vitamin D every week. We discussed the role of turmeric as well.  Patient understands the discussion and I provided her with written information. We can see her on an as-needed basis. 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  All questions were answered. The patient knows to call the clinic with any problems, questions or concerns.   Rulon Eisenmenger, MD, MPH 01/20/2020    I, Molly Dorshimer, am acting as scribe for Nicholas Lose, MD.  I have reviewed the above documentation for accuracy and completeness, and I agree with the above.

## 2020-01-20 ENCOUNTER — Other Ambulatory Visit: Payer: Self-pay

## 2020-01-20 ENCOUNTER — Inpatient Hospital Stay: Payer: 59 | Attending: Hematology and Oncology | Admitting: Hematology and Oncology

## 2020-01-20 DIAGNOSIS — N6489 Other specified disorders of breast: Secondary | ICD-10-CM | POA: Diagnosis present

## 2020-01-20 DIAGNOSIS — N6082 Other benign mammary dysplasias of left breast: Secondary | ICD-10-CM | POA: Insufficient documentation

## 2020-01-20 NOTE — Assessment & Plan Note (Signed)
Mammogram and [place] on 10/02/19 showed two right breast cysts, and several left breast cysts with a suspicious distortion in the lower-inner quadrant. Biopsy on 10/09/19 showed radial scar with usual ductal hyperplasia. Left lumpectomy 12/19/2019: Complex sclerosing lesion (radial scar) with adenosis and apocrine metaplasia.  Negative for carcinoma.  Pathology counseling: Radial scars, also called complex sclerosing lesions, are a pathologic diagnosis, usually discovered incidentally when a breast mass or radiologic abnormality is removed or biopsied. Occasionally, radial scars are large enough to be detected on mammography as suspicious spiculated masses, which cannot be reliably differentiated from spiculated carcinoma by imaging alone. radial scars are excised because most case series show that 8 to 17 percent of surgical specimens at subsequent excision are positive for malignancy or DCIS.  Risk assessment: Tyrer Cusick:

## 2020-01-20 NOTE — Telephone Encounter (Signed)
Called pt to schedule TLH/BS with Dr Kenton Kingfisher. After going over a few dates she mentioned that she was wanting to have surgery ASAP due to new employment starting the last week in May.  Would 5/4 be a possibility? I checked with Dr Glennon Mac to see if he will need Kenton Kingfisher as an assist on his cases that day. With this being said, Dr Kenton Kingfisher would need a RNFA for this surgery.  The H&P would need to be done ASAP or on the day of.   Will follow up with Dr Kenton Kingfisher and get back to pt.

## 2020-01-21 ENCOUNTER — Encounter: Payer: Self-pay | Admitting: Obstetrics and Gynecology

## 2020-01-21 NOTE — Telephone Encounter (Signed)
5/4 is definitely a go Have her come in today or tomorrow to see me as an overbook to get consents, please

## 2020-01-22 ENCOUNTER — Ambulatory Visit (INDEPENDENT_AMBULATORY_CARE_PROVIDER_SITE_OTHER): Payer: 59 | Admitting: Obstetrics & Gynecology

## 2020-01-22 ENCOUNTER — Encounter: Payer: Self-pay | Admitting: Obstetrics & Gynecology

## 2020-01-22 ENCOUNTER — Other Ambulatory Visit: Payer: Self-pay

## 2020-01-22 VITALS — BP 118/70 | Ht 66.0 in | Wt 193.0 lb

## 2020-01-22 DIAGNOSIS — R87610 Atypical squamous cells of undetermined significance on cytologic smear of cervix (ASC-US): Secondary | ICD-10-CM

## 2020-01-22 DIAGNOSIS — N946 Dysmenorrhea, unspecified: Secondary | ICD-10-CM | POA: Insufficient documentation

## 2020-01-22 DIAGNOSIS — N809 Endometriosis, unspecified: Secondary | ICD-10-CM | POA: Insufficient documentation

## 2020-01-22 DIAGNOSIS — N920 Excessive and frequent menstruation with regular cycle: Secondary | ICD-10-CM

## 2020-01-22 NOTE — Progress Notes (Signed)
PRE-OPERATIVE HISTORY AND PHYSICAL EXAM  HPI:  Desiree Lee is a 44 y.o. No obstetric history on file. Patient's last menstrual period was 01/08/2020.; she is being admitted for surgery related to pelvic pain and heavy periods.  The patient has continued prolonged and painful cycles (reg, 6d w 3d heavy, painful one week prior and then during, known endomtriosis).  Prior BTL, no desire for reversal.  PMHx: Past Medical History:  Diagnosis Date  . Chronic sinusitis   . Deviated nasal septum   . Family history of breast cancer    4/21 cancer genetic testing letter sent  . GERD (gastroesophageal reflux disease)   . Headache    sinus  . Motion sickness    reading in cars  . PONV (postoperative nausea and vomiting)    also, low BP, wakes up slow   Past Surgical History:  Procedure Laterality Date  . BREAST BIOPSY Left 10/09/2019   affirm bx, x marker, path pending  . BREAST LUMPECTOMY WITH RADIOACTIVE SEED LOCALIZATION Left 12/19/2019   Procedure: LEFT BREAST LUMPECTOMY WITH RADIOACTIVE SEED LOCALIZATION;  Surgeon: Desiree Kussmaul, MD;  Location: [facility];  Service: General;  Laterality: Left;  . IMAGE GUIDED SINUS SURGERY N/A 01/29/2016   Procedure: IMAGE GUIDED SINUS SURGERY;  Surgeon: Desiree Gust, MD;  Location: [facility];  Service: ENT;  Laterality: N/A;  . LAPAROSCOPY  2009   for endometriosis  . MAXILLARY ANTROSTOMY Bilateral 01/29/2016   Procedure: ENDOSCOPIC MAXILLARY ANTROSTOMY WITH TISSUE;  Surgeon: Desiree Gust, MD;  Location: [facility];  Service: ENT;  Laterality: Bilateral;  . NASAL TURBINATE REDUCTION Bilateral 01/29/2016   Procedure: TURBINATE REDUCTION/SUBMUCOSAL RESECTION and bilateral SMR middle turbinates.;  Surgeon: Desiree Gust, MD;  Location: [facility];  Service: ENT;  Laterality: Bilateral;  . OTOPLASTY  1983  . SEPTOPLASTY N/A 01/29/2016   Procedure: SEPTOPLASTY;  Surgeon: Desiree Gust, MD;  Location: [facility];  Service: ENT;  Laterality: N/A;  DISK GAVE DISK TO TABITHA 4-06 KP  . TUBAL LIGATION  01/21/14   Family History  Problem Relation Age of Onset  . Breast cancer Maternal Aunt 4   Social History   Tobacco Use  . Smoking status: Former Smoker    Quit date: 09/30/2005    Years since quitting: 14.3  . Smokeless tobacco: Never Used  Substance Use Topics  . Alcohol use: No  . Drug use: Not on file    Current Outpatient Medications:  .  fluticasone (FLONASE) 50 MCG/ACT nasal spray, Place 2 sprays into both nostrils at bedtime as needed for allergies or rhinitis. , Disp: , Rfl:  .  levocetirizine (XYZAL) 5 MG tablet, Take 5 mg by mouth at bedtime. , Disp: , Rfl:  .  naproxen sodium (ALEVE) 220 MG tablet, Take 220 mg by mouth 3 (three) times daily as needed (menstration pain)., Disp: , Rfl:  .  Vitamin D, Ergocalciferol, (DRISDOL) 1.25 MG (50000 UNIT) CAPS capsule, Take 50,000 Units by mouth every Tuesday at 6 PM., Disp: , Rfl:  Allergies: Bactrim [sulfamethoxazole-trimethoprim]  Review of Systems  Constitutional: Negative for chills, fever and malaise/fatigue.  HENT: Negative for congestion, sinus pain and sore throat.   Eyes: Negative for blurred vision and pain.  Respiratory: Negative for cough and wheezing.   Cardiovascular: Negative for chest pain and leg swelling.  Gastrointestinal: Negative for abdominal pain, constipation, diarrhea, heartburn, nausea and vomiting.  Genitourinary: Negative for dysuria, frequency, hematuria and urgency.  Musculoskeletal:  Negative for back pain, joint pain, myalgias and neck pain.  Skin: Negative for itching and rash.  Neurological: Negative for dizziness, tremors and weakness.  Endo/Heme/Allergies: Does not bruise/bleed easily.  Psychiatric/Behavioral: Negative for depression. The patient is not nervous/anxious and does not have insomnia.     Objective: BP 118/70   Ht '5\' 6"'$  (1.676 m)   Wt 193 lb (87.5 kg)   LMP 01/08/2020   BMI  31.15 kg/m   Filed Weights   01/22/20 1123  Weight: 193 lb (87.5 kg)   Physical Exam Constitutional:      General: She is not in acute distress.    Appearance: She is well-developed.  HENT:     Head: Normocephalic and atraumatic. No laceration.     Right Ear: Hearing normal.     Left Ear: Hearing normal.     Mouth/Throat:     Pharynx: Uvula midline.  Eyes:     Pupils: Pupils are equal, round, and reactive to light.  Neck:     Thyroid: No thyromegaly.  Cardiovascular:     Rate and Rhythm: Normal rate and regular rhythm.     Heart sounds: No murmur. No friction rub. No gallop.   Pulmonary:     Effort: Pulmonary effort is normal. No respiratory distress.     Breath sounds: Normal breath sounds. No wheezing.  Chest:     Breasts:        Right: No mass, skin change or tenderness.        Left: No mass, skin change or tenderness.  Abdominal:     General: Bowel sounds are normal. There is no distension.     Palpations: Abdomen is soft.     Tenderness: There is no abdominal tenderness. There is no rebound.  Musculoskeletal:        General: Normal range of motion.     Cervical back: Normal range of motion and neck supple.  Neurological:     Mental Status: She is alert and oriented to person, place, and time.     Cranial Nerves: No cranial nerve deficit.  Skin:    General: Skin is warm and dry.  Psychiatric:        Judgment: Judgment normal.  Vitals reviewed.     Assessment: 1. Endometriosis   2. Menorrhagia with regular cycle   3. Dysmenorrhea   4. ASCUS of cervix with negative high risk HPV   Plan TLH, BS Preservation of ovaries discussed and planned, even with concerns for increased breast cancer risk although she has not had BRCA testing yet.  PAP results discussed, hysterectomy certainly will resolve any cervical concerns.  I have had a careful discussion with this patient about all the options available and the risk/benefits of each. I have fully informed this  patient that surgery may subject her to a variety of discomforts and risks: She understands that most patients have surgery with little difficulty, but problems can happen ranging from minor to fatal. These include nausea, vomiting, pain, bleeding, infection, poor healing, hernia, or formation of adhesions. Unexpected reactions may occur from any drug or anesthetic given. Unintended injury may occur to other pelvic or abdominal structures such as Fallopian tubes, ovaries, bladder, ureter (tube from kidney to bladder), or bowel. Nerves going from the pelvis to the legs may be injured. Any such injury may require immediate or later additional surgery to correct the problem. Excessive blood loss requiring transfusion is very unlikely but possible. Dangerous blood clots may form in the  legs or lungs. Physical and sexual activity will be restricted in varying degrees for an indeterminate period of time but most often 2-6 weeks.  Finally, she understands that it is impossible to list every possible undesirable effect and that the condition for which surgery is done is not always cured or significantly improved, and in rare cases may be even worse.Ample time was given to answer all questions.  Barnett Applebaum, MD, [facility], [facility] 01/22/2020  11:49 AM

## 2020-01-22 NOTE — Patient Instructions (Signed)

## 2020-01-22 NOTE — H&P (View-Only) (Signed)
PRE-OPERATIVE HISTORY AND PHYSICAL EXAM  HPI:  Desiree Lee is a 44 y.o. No obstetric history on file. Patient's last menstrual period was 01/08/2020.; she is being admitted for surgery related to pelvic pain and heavy periods.  The patient has continued prolonged and painful cycles (reg, 6d w 3d heavy, painful one week prior and then during, known endomtriosis).  Prior BTL, no desire for reversal.  PMHx: Past Medical History:  Diagnosis Date  . Chronic sinusitis   . Deviated nasal septum   . Family history of breast cancer    4/21 cancer genetic testing letter sent  . GERD (gastroesophageal reflux disease)   . Headache    sinus  . Motion sickness    reading in cars  . PONV (postoperative nausea and vomiting)    also, low BP, wakes up slow   Past Surgical History:  Procedure Laterality Date  . BREAST BIOPSY Left 10/09/2019   affirm bx, x marker, path pending  . BREAST LUMPECTOMY WITH RADIOACTIVE SEED LOCALIZATION Left 12/19/2019   Procedure: LEFT BREAST LUMPECTOMY WITH RADIOACTIVE SEED LOCALIZATION;  Surgeon: Desiree Kussmaul, MD;  Location: [facility];  Service: General;  Laterality: Left;  . IMAGE GUIDED SINUS SURGERY N/A 01/29/2016   Procedure: IMAGE GUIDED SINUS SURGERY;  Surgeon: Desiree Gust, MD;  Location: [facility];  Service: ENT;  Laterality: N/A;  . LAPAROSCOPY  2009   for endometriosis  . MAXILLARY ANTROSTOMY Bilateral 01/29/2016   Procedure: ENDOSCOPIC MAXILLARY ANTROSTOMY WITH TISSUE;  Surgeon: Desiree Gust, MD;  Location: [facility];  Service: ENT;  Laterality: Bilateral;  . NASAL TURBINATE REDUCTION Bilateral 01/29/2016   Procedure: TURBINATE REDUCTION/SUBMUCOSAL RESECTION and bilateral SMR middle turbinates.;  Surgeon: Desiree Gust, MD;  Location: [facility];  Service: ENT;  Laterality: Bilateral;  . OTOPLASTY  1983  . SEPTOPLASTY N/A 01/29/2016   Procedure: SEPTOPLASTY;  Surgeon: Desiree Gust, MD;  Location: [facility];  Service: ENT;  Laterality: N/A;  DISK GAVE DISK TO Desiree Lee 4-06 KP  . TUBAL LIGATION  01/21/14   Family History  Problem Relation Age of Onset  . Breast cancer Maternal Aunt 4   Social History   Tobacco Use  . Smoking status: Former Smoker    Quit date: 09/30/2005    Years since quitting: 14.3  . Smokeless tobacco: Never Used  Substance Use Topics  . Alcohol use: No  . Drug use: Not on file    Current Outpatient Medications:  .  fluticasone (FLONASE) 50 MCG/ACT nasal spray, Place 2 sprays into both nostrils at bedtime as needed for allergies or rhinitis. , Disp: , Rfl:  .  levocetirizine (XYZAL) 5 MG tablet, Take 5 mg by mouth at bedtime. , Disp: , Rfl:  .  naproxen sodium (ALEVE) 220 MG tablet, Take 220 mg by mouth 3 (three) times daily as needed (menstration pain)., Disp: , Rfl:  .  Vitamin D, Ergocalciferol, (DRISDOL) 1.25 MG (50000 UNIT) CAPS capsule, Take 50,000 Units by mouth every Tuesday at 6 PM., Disp: , Rfl:  Allergies: Bactrim [sulfamethoxazole-trimethoprim]  Review of Systems  Constitutional: Negative for chills, fever and malaise/fatigue.  HENT: Negative for congestion, sinus pain and sore throat.   Eyes: Negative for blurred vision and pain.  Respiratory: Negative for cough and wheezing.   Cardiovascular: Negative for chest pain and leg swelling.  Gastrointestinal: Negative for abdominal pain, constipation, diarrhea, heartburn, nausea and vomiting.  Genitourinary: Negative for dysuria, frequency, hematuria and urgency.  Musculoskeletal:  Negative for back pain, joint pain, myalgias and neck pain.  Skin: Negative for itching and rash.  Neurological: Negative for dizziness, tremors and weakness.  Endo/Heme/Allergies: Does not bruise/bleed easily.  Psychiatric/Behavioral: Negative for depression. The patient is not nervous/anxious and does not have insomnia.     Objective: BP 118/70   Ht '5\' 6"'$  (1.676 m)   Wt 193 lb (87.5 kg)   LMP 01/08/2020   BMI  31.15 kg/m   Filed Weights   01/22/20 1123  Weight: 193 lb (87.5 kg)   Physical Exam Constitutional:      General: She is not in acute distress.    Appearance: She is well-developed.  HENT:     Head: Normocephalic and atraumatic. No laceration.     Right Ear: Hearing normal.     Left Ear: Hearing normal.     Mouth/Throat:     Pharynx: Uvula midline.  Eyes:     Pupils: Pupils are equal, round, and reactive to light.  Neck:     Thyroid: No thyromegaly.  Cardiovascular:     Rate and Rhythm: Normal rate and regular rhythm.     Heart sounds: No murmur. No friction rub. No gallop.   Pulmonary:     Effort: Pulmonary effort is normal. No respiratory distress.     Breath sounds: Normal breath sounds. No wheezing.  Chest:     Breasts:        Right: No mass, skin change or tenderness.        Left: No mass, skin change or tenderness.  Abdominal:     General: Bowel sounds are normal. There is no distension.     Palpations: Abdomen is soft.     Tenderness: There is no abdominal tenderness. There is no rebound.  Musculoskeletal:        General: Normal range of motion.     Cervical back: Normal range of motion and neck supple.  Neurological:     Mental Status: She is alert and oriented to person, place, and time.     Cranial Nerves: No cranial nerve deficit.  Skin:    General: Skin is warm and dry.  Psychiatric:        Judgment: Judgment normal.  Vitals reviewed.     Assessment: 1. Endometriosis   2. Menorrhagia with regular cycle   3. Dysmenorrhea   4. ASCUS of cervix with negative high risk HPV   Plan TLH, BS Preservation of ovaries discussed and planned, even with concerns for increased breast cancer risk although she has not had BRCA testing yet.  PAP results discussed, hysterectomy certainly will resolve any cervical concerns.  I have had a careful discussion with this patient about all the options available and the risk/benefits of each. I have fully informed this  patient that surgery may subject her to a variety of discomforts and risks: She understands that most patients have surgery with little difficulty, but problems can happen ranging from minor to fatal. These include nausea, vomiting, pain, bleeding, infection, poor healing, hernia, or formation of adhesions. Unexpected reactions may occur from any drug or anesthetic given. Unintended injury may occur to other pelvic or abdominal structures such as Fallopian tubes, ovaries, bladder, ureter (tube from kidney to bladder), or bowel. Nerves going from the pelvis to the legs may be injured. Any such injury may require immediate or later additional surgery to correct the problem. Excessive blood loss requiring transfusion is very unlikely but possible. Dangerous blood clots may form in the  legs or lungs. Physical and sexual activity will be restricted in varying degrees for an indeterminate period of time but most often 2-6 weeks.  Finally, she understands that it is impossible to list every possible undesirable effect and that the condition for which surgery is done is not always cured or significantly improved, and in rare cases may be even worse.Ample time was given to answer all questions.  Barnett Applebaum, MD, [facility], [facility] 01/22/2020  11:49 AM

## 2020-01-23 ENCOUNTER — Other Ambulatory Visit: Payer: Self-pay | Admitting: Obstetrics & Gynecology

## 2020-01-23 ENCOUNTER — Encounter
Admission: RE | Admit: 2020-01-23 | Discharge: 2020-01-23 | Disposition: A | Discharge status: Home / Self Care | Payer: 59 | Source: Ambulatory Visit | Attending: Obstetrics & Gynecology | Admitting: Obstetrics & Gynecology

## 2020-01-23 NOTE — Patient Instructions (Addendum)
Your procedure is scheduled on: Tuesday Jan 28, 2020 Report to Day Surgery. To find out your arrival time please call [phone] between 1PM - 3PM on Monday Jan 27, 2020.  Remember: Instructions that are not followed completely may result in serious medical risk,  up to and including death, or upon the discretion of your surgeon and anesthesiologist your  surgery may need to be rescheduled.     _X__ 1. Do not eat food after midnight the night before your procedure.                 No gum chewing or hard candies. You may drink clear liquids up to 2 hours                 before you are scheduled to arrive for your surgery- DO not drink clear                 liquids within 2 hours of the start of your surgery.                 Clear Liquids include:  water, apple juice without pulp, clear Gatorade, G2 or                  Gatorade Zero (avoid Red/Purple/Blue), Black Coffee or Tea (Do not add                 anything to coffee or tea).  __X__2.  On the morning of surgery brush your teeth with toothpaste and water, you                may rinse your mouth with mouthwash if you wish.  Do not swallow any toothpaste of mouthwash.     _X__ 3.  No Alcohol for 24 hours before or after surgery.   _X__ 4.  Do Not Smoke or use e-cigarettes For 24 Hours Prior to Your Surgery.                 Do not use any chewable tobacco products for at least 6 hours prior to                 Surgery.  _X__  5.  Do not use any recreational drugs (marijuana, cocaine, heroin, ecstacy, MDMA or other)                For at least one week prior to your surgery.  Combination of these drugs with anesthesia                May have life threatening results.  __X__ 6.  Notify your doctor if there is any change in your medical condition      (cold, fever, infections).     Do not wear jewelry, make-up, hairpins, clips or nail polish. Do not wear lotions, powders, or perfumes. You may wear deodorant. Do  not shave 48 hours prior to surgery. Men may shave face and neck. Do not bring valuables to the hospital.    [facility] is not responsible for any belongings or valuables.  Contacts, dentures or bridgework may not be worn into surgery. Leave your suitcase in the car. After surgery it may be brought to your room. For patients admitted to the hospital, discharge time is determined by your treatment team.   Patients discharged the day of surgery will not be allowed to drive home.   Make arrangements for someone to be with you for the first  24 hours of your Same Day Discharge.    __X__ Take these medicines the morning of surgery:    1. fluticasone (FLONASE) 50 MCG/ACT nasal spray  __X_ Stop Anti-inflammatories such as Ibuprofen, Aleve, naproxen, aspirin and or BC powders.    __X__ Stop supplements until after surgery.    __X__ Do not add any herbal supplements before your surgery.

## 2020-01-24 ENCOUNTER — Other Ambulatory Visit: Payer: Self-pay

## 2020-01-24 ENCOUNTER — Other Ambulatory Visit
Admission: RE | Admit: 2020-01-24 | Discharge: 2020-01-24 | Disposition: A | Discharge status: Home / Self Care | Payer: 59 | Source: Ambulatory Visit | Attending: Obstetrics & Gynecology | Admitting: Obstetrics & Gynecology

## 2020-01-24 DIAGNOSIS — Z01812 Encounter for preprocedural laboratory examination: Secondary | ICD-10-CM | POA: Insufficient documentation

## 2020-01-24 DIAGNOSIS — Z20822 Contact with and (suspected) exposure to covid-19: Secondary | ICD-10-CM | POA: Diagnosis not present

## 2020-01-24 LAB — TYPE AND SCREEN
ABO/RH(D): O POS
Antibody Screen: NEGATIVE

## 2020-01-24 LAB — CBC
HCT: 38.7 % (ref 36.0–46.0)
Hemoglobin: 13.5 g/dL (ref 12.0–15.0)
MCH: 29.1 pg (ref 26.0–34.0)
MCHC: 34.9 g/dL (ref 30.0–36.0)
MCV: 83.4 fL (ref 80.0–100.0)
Platelets: 200 10*3/uL (ref 150–400)
RBC: 4.64 MIL/uL (ref 3.87–5.11)
RDW: 12.3 % (ref 11.5–15.5)
WBC: 8.2 10*3/uL (ref 4.0–10.5)
nRBC: 0 % (ref 0.0–0.2)

## 2020-01-25 LAB — SARS CORONAVIRUS 2 (TAT 6-24 HRS): SARS Coronavirus 2: NEGATIVE

## 2020-01-27 MED ORDER — SODIUM CHLORIDE 0.9 % IV SOLN
2.0000 g | INTRAVENOUS | Status: AC
  Administered 2020-01-28: 2 g via INTRAVENOUS

## 2020-01-28 ENCOUNTER — Encounter
Admission: RE | Disposition: A | Discharge status: Home / Self Care | Payer: Self-pay | Source: Home / Self Care | Attending: Obstetrics & Gynecology

## 2020-01-28 ENCOUNTER — Ambulatory Visit: Payer: 59 | Admitting: Anesthesiology

## 2020-01-28 ENCOUNTER — Ambulatory Visit
Admission: RE | Admit: 2020-01-28 | Discharge: 2020-01-28 | Disposition: A | Discharge status: Home / Self Care | Payer: 59 | Attending: Obstetrics & Gynecology | Admitting: Obstetrics & Gynecology

## 2020-01-28 ENCOUNTER — Encounter: Payer: Self-pay | Admitting: Obstetrics & Gynecology

## 2020-01-28 ENCOUNTER — Other Ambulatory Visit: Payer: Self-pay

## 2020-01-28 DIAGNOSIS — N9985 Post endometrial ablation syndrome: Secondary | ICD-10-CM

## 2020-01-28 DIAGNOSIS — R102 Pelvic and perineal pain unspecified side: Secondary | ICD-10-CM

## 2020-01-28 DIAGNOSIS — K660 Peritoneal adhesions (postprocedural) (postinfection): Secondary | ICD-10-CM | POA: Insufficient documentation

## 2020-01-28 DIAGNOSIS — Z87891 Personal history of nicotine dependence: Secondary | ICD-10-CM | POA: Insufficient documentation

## 2020-01-28 DIAGNOSIS — Z881 Allergy status to other antibiotic agents status: Secondary | ICD-10-CM | POA: Diagnosis not present

## 2020-01-28 DIAGNOSIS — N946 Dysmenorrhea, unspecified: Secondary | ICD-10-CM | POA: Diagnosis not present

## 2020-01-28 DIAGNOSIS — N920 Excessive and frequent menstruation with regular cycle: Secondary | ICD-10-CM | POA: Diagnosis present

## 2020-01-28 DIAGNOSIS — N809 Endometriosis, unspecified: Secondary | ICD-10-CM | POA: Diagnosis present

## 2020-01-28 HISTORY — PX: TOTAL LAPAROSCOPIC HYSTERECTOMY WITH SALPINGECTOMY: SHX6742

## 2020-01-28 LAB — ABO/RH: ABO/RH(D): O POS

## 2020-01-28 LAB — POCT PREGNANCY, URINE: Preg Test, Ur: NEGATIVE

## 2020-01-28 SURGERY — HYSTERECTOMY, TOTAL, LAPAROSCOPIC, WITH SALPINGECTOMY
Anesthesia: General | Laterality: Bilateral

## 2020-01-28 MED ORDER — LACTATED RINGERS IV SOLN: INTRAVENOUS | Status: DC

## 2020-01-28 MED ORDER — LIDOCAINE HCL (CARDIAC) PF 100 MG/5ML IV SOSY
PREFILLED_SYRINGE | INTRAVENOUS | Status: DC | PRN
  Administered 2020-01-28: 60 mg via INTRAVENOUS

## 2020-01-28 MED ORDER — FENTANYL CITRATE (PF) 100 MCG/2ML IJ SOLN
INTRAMUSCULAR | Status: DC | PRN
  Administered 2020-01-28 (×2): 50 ug via INTRAVENOUS
  Administered 2020-01-28: 100 ug via INTRAVENOUS

## 2020-01-28 MED ORDER — MORPHINE SULFATE (PF) 4 MG/ML IV SOLN: 1.0000 mg | INTRAVENOUS | Status: DC | PRN

## 2020-01-28 MED ORDER — SEVOFLURANE IN SOLN
RESPIRATORY_TRACT | Status: AC
  Filled 2020-01-28: qty 250

## 2020-01-28 MED ORDER — LIDOCAINE HCL (PF) 2 % IJ SOLN
INTRAMUSCULAR | Status: AC
  Filled 2020-01-28: qty 5

## 2020-01-28 MED ORDER — FENTANYL CITRATE (PF) 100 MCG/2ML IJ SOLN
INTRAMUSCULAR | Status: AC
  Filled 2020-01-28: qty 2

## 2020-01-28 MED ORDER — ONDANSETRON HCL 4 MG/2ML IJ SOLN
4.0000 mg | Freq: Once | INTRAMUSCULAR | Status: AC | PRN
  Administered 2020-01-28: 4 mg via INTRAVENOUS

## 2020-01-28 MED ORDER — BUPIVACAINE HCL (PF) 0.5 % IJ SOLN
INTRAMUSCULAR | Status: AC
  Filled 2020-01-28: qty 30

## 2020-01-28 MED ORDER — BUPIVACAINE HCL (PF) 0.5 % IJ SOLN
INTRAMUSCULAR | Status: DC | PRN
  Administered 2020-01-28: 12 mL

## 2020-01-28 MED ORDER — MIDAZOLAM HCL 2 MG/2ML IJ SOLN
INTRAMUSCULAR | Status: DC | PRN
  Administered 2020-01-28: 2 mg via INTRAVENOUS

## 2020-01-28 MED ORDER — OXYCODONE-ACETAMINOPHEN 5-325 MG PO TABS: 1.0000 | ORAL_TABLET | ORAL | 0 refills | Status: DC | PRN

## 2020-01-28 MED ORDER — FENTANYL CITRATE (PF) 100 MCG/2ML IJ SOLN
25.0000 ug | INTRAMUSCULAR | Status: DC | PRN
  Administered 2020-01-28: 25 ug via INTRAVENOUS

## 2020-01-28 MED ORDER — SUGAMMADEX SODIUM 200 MG/2ML IV SOLN
INTRAVENOUS | Status: DC | PRN
  Administered 2020-01-28: 200 mg via INTRAVENOUS

## 2020-01-28 MED ORDER — OXYCODONE-ACETAMINOPHEN 5-325 MG PO TABS
ORAL_TABLET | ORAL | Status: AC
  Filled 2020-01-28: qty 1

## 2020-01-28 MED ORDER — FENTANYL CITRATE (PF) 100 MCG/2ML IJ SOLN
INTRAMUSCULAR | Status: AC
  Administered 2020-01-28: 25 ug via INTRAVENOUS
  Filled 2020-01-28: qty 2

## 2020-01-28 MED ORDER — SCOPOLAMINE 1 MG/3DAYS TD PT72
MEDICATED_PATCH | TRANSDERMAL | Status: AC
  Administered 2020-01-28: 1.5 mg via TRANSDERMAL
  Filled 2020-01-28: qty 1

## 2020-01-28 MED ORDER — PROPOFOL 10 MG/ML IV BOLUS
INTRAVENOUS | Status: DC | PRN
  Administered 2020-01-28: 130 mg via INTRAVENOUS

## 2020-01-28 MED ORDER — ACETAMINOPHEN 325 MG PO TABS: 650.0000 mg | ORAL_TABLET | ORAL | Status: DC | PRN

## 2020-01-28 MED ORDER — DEXAMETHASONE SODIUM PHOSPHATE 10 MG/ML IJ SOLN
INTRAMUSCULAR | Status: DC | PRN
  Administered 2020-01-28: 10 mg via INTRAVENOUS

## 2020-01-28 MED ORDER — SCOPOLAMINE 1 MG/3DAYS TD PT72: 1.0000 | MEDICATED_PATCH | TRANSDERMAL | Status: DC

## 2020-01-28 MED ORDER — PHENYLEPHRINE HCL (PRESSORS) 10 MG/ML IV SOLN
INTRAVENOUS | Status: DC | PRN
  Administered 2020-01-28 (×2): 100 ug via INTRAVENOUS

## 2020-01-28 MED ORDER — ACETAMINOPHEN 10 MG/ML IV SOLN
INTRAVENOUS | Status: DC | PRN
  Administered 2020-01-28: 1000 mg via INTRAVENOUS

## 2020-01-28 MED ORDER — MIDAZOLAM HCL 2 MG/2ML IJ SOLN
INTRAMUSCULAR | Status: AC
  Filled 2020-01-28: qty 2

## 2020-01-28 MED ORDER — ACETAMINOPHEN 650 MG RE SUPP
650.0000 mg | RECTAL | Status: DC | PRN
  Filled 2020-01-28: qty 1

## 2020-01-28 MED ORDER — OXYCODONE HCL 5 MG PO TABS
ORAL_TABLET | ORAL | Status: AC
  Filled 2020-01-28: qty 1

## 2020-01-28 MED ORDER — ACETAMINOPHEN 10 MG/ML IV SOLN
INTRAVENOUS | Status: AC
  Filled 2020-01-28: qty 100

## 2020-01-28 MED ORDER — SODIUM CHLORIDE 0.9 % IV SOLN
INTRAVENOUS | Status: AC
  Filled 2020-01-28: qty 2

## 2020-01-28 MED ORDER — FAMOTIDINE 20 MG PO TABS
ORAL_TABLET | ORAL | Status: AC
  Administered 2020-01-28: 20 mg via ORAL
  Filled 2020-01-28: qty 1

## 2020-01-28 MED ORDER — ONDANSETRON HCL 4 MG/2ML IJ SOLN
INTRAMUSCULAR | Status: DC | PRN
  Administered 2020-01-28: 4 mg via INTRAVENOUS

## 2020-01-28 MED ORDER — ONDANSETRON HCL 4 MG/2ML IJ SOLN
INTRAMUSCULAR | Status: AC
  Filled 2020-01-28: qty 2

## 2020-01-28 MED ORDER — ROCURONIUM BROMIDE 100 MG/10ML IV SOLN
INTRAVENOUS | Status: DC | PRN
  Administered 2020-01-28: 50 mg via INTRAVENOUS
  Administered 2020-01-28: 10 mg via INTRAVENOUS
  Administered 2020-01-28: 20 mg via INTRAVENOUS

## 2020-01-28 MED ORDER — OXYCODONE-ACETAMINOPHEN 5-325 MG PO TABS
1.0000 | ORAL_TABLET | ORAL | Status: DC | PRN
  Administered 2020-01-28: 1 via ORAL

## 2020-01-28 MED ORDER — PROPOFOL 10 MG/ML IV BOLUS
INTRAVENOUS | Status: AC
  Filled 2020-01-28: qty 20

## 2020-01-28 MED ORDER — FAMOTIDINE 20 MG PO TABS: 20.0000 mg | ORAL_TABLET | Freq: Once | ORAL | Status: AC

## 2020-01-28 SURGICAL SUPPLY — 51 items
BAG URINE DRAIN 2000ML AR STRL (UROLOGICAL SUPPLIES) ×3 IMPLANT
BLADE SURG SZ11 CARB STEEL (BLADE) ×3 IMPLANT
CANISTER SUCT 1200ML W/VALVE (MISCELLANEOUS) ×3 IMPLANT
CATH FOLEY 2WAY  5CC 16FR (CATHETERS) ×2
CATH URTH 16FR FL 2W BLN LF (CATHETERS) ×1 IMPLANT
CHLORAPREP W/TINT 26 (MISCELLANEOUS) ×3 IMPLANT
COVER WAND RF STERILE (DRAPES) ×3 IMPLANT
DEFOGGER SCOPE WARMER CLEARIFY (MISCELLANEOUS) ×3 IMPLANT
DERMABOND ADVANCED (GAUZE/BANDAGES/DRESSINGS) ×2
DERMABOND ADVANCED .7 DNX12 (GAUZE/BANDAGES/DRESSINGS) ×1 IMPLANT
DEVICE SUTURE ENDOST 10MM (ENDOMECHANICALS) IMPLANT
DRAPE CAMERA CLOSED 9X96 (DRAPES) ×3 IMPLANT
DRSG TEGADERM 2-3/8X2-3/4 SM (GAUZE/BANDAGES/DRESSINGS) IMPLANT
GLOVE BIO SURGEON STRL SZ8 (GLOVE) ×15 IMPLANT
GLOVE INDICATOR 8.0 STRL GRN (GLOVE) ×3 IMPLANT
GOWN STRL REUS W/ TWL LRG LVL3 (GOWN DISPOSABLE) ×1 IMPLANT
GOWN STRL REUS W/ TWL XL LVL3 (GOWN DISPOSABLE) ×2 IMPLANT
GOWN STRL REUS W/TWL LRG LVL3 (GOWN DISPOSABLE) ×2
GOWN STRL REUS W/TWL XL LVL3 (GOWN DISPOSABLE) ×4
GRASPER SUT TROCAR 14GX15 (MISCELLANEOUS) ×3 IMPLANT
IRRIGATION STRYKERFLOW (MISCELLANEOUS) ×1 IMPLANT
IRRIGATOR STRYKERFLOW (MISCELLANEOUS) ×3
IV LACTATED RINGERS 1000ML (IV SOLUTION) ×6 IMPLANT
KIT PINK PAD W/HEAD ARE REST (MISCELLANEOUS) ×3
KIT PINK PAD W/HEAD ARM REST (MISCELLANEOUS) ×1 IMPLANT
KIT TURNOVER CYSTO (KITS) ×3 IMPLANT
LABEL OR SOLS (LABEL) ×3 IMPLANT
MANIPULATOR VCARE LG CRV RETR (MISCELLANEOUS) ×3 IMPLANT
MANIPULATOR VCARE SML CRV RETR (MISCELLANEOUS) IMPLANT
MANIPULATOR VCARE STD CRV RETR (MISCELLANEOUS) IMPLANT
NEEDLE VERESS 14GA 120MM (NEEDLE) ×3 IMPLANT
NS IRRIG 500ML POUR BTL (IV SOLUTION) ×3 IMPLANT
OCCLUDER COLPOPNEUMO (BALLOONS) IMPLANT
PACK GYN LAPAROSCOPIC (MISCELLANEOUS) ×3 IMPLANT
PAD OB MATERNITY 4.3X12.25 (PERSONAL CARE ITEMS) ×3 IMPLANT
PAD PREP 24X41 OB/GYN DISP (PERSONAL CARE ITEMS) ×3 IMPLANT
PORT ACCESS TROCAR AIRSEAL 12 (TROCAR) ×1 IMPLANT
PORT ACCESS TROCAR AIRSEAL 5M (TROCAR) ×2
SCISSORS METZENBAUM CVD 33 (INSTRUMENTS) IMPLANT
SET CYSTO W/LG BORE CLAMP LF (SET/KITS/TRAYS/PACK) ×3 IMPLANT
SET TRI-LUMEN FLTR TB AIRSEAL (TUBING) ×3 IMPLANT
SHEARS HARMONIC ACE PLUS 36CM (ENDOMECHANICALS) ×3 IMPLANT
SLEEVE ENDOPATH XCEL 5M (ENDOMECHANICALS) ×3 IMPLANT
SPONGE GAUZE 2X2 8PLY STER LF (GAUZE/BANDAGES/DRESSINGS)
SPONGE GAUZE 2X2 8PLY STRL LF (GAUZE/BANDAGES/DRESSINGS) IMPLANT
SUT ENDO VLOC 180-0-8IN (SUTURE) ×3 IMPLANT
SUT VIC AB 0 CT1 36 (SUTURE) ×3 IMPLANT
SUT VIC AB 4-0 FS2 27 (SUTURE) ×3 IMPLANT
SYR 10ML LL (SYRINGE) ×3 IMPLANT
SYR 50ML LL SCALE MARK (SYRINGE) ×3 IMPLANT
TROCAR XCEL NON-BLD 5MMX100MML (ENDOMECHANICALS) ×3 IMPLANT

## 2020-01-28 NOTE — Interval H&P Note (Signed)
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  The various methods of treatment have been discussed with the patient and family. After consideration of risks, benefits and other options for treatment, the patient has consented to  Procedure(s) with comments: TOTAL LAPAROSCOPIC HYSTERECTOMY WITH SALPINGECTOMY (Bilateral) , Cystoscopy as a surgical intervention.  The patient's history has been reviewed, patient examined, no change in status, stable for surgery.  I have reviewed the patient's chart and labs.  Questions were answered to the patient's satisfaction.     Hoyt Koch

## 2020-01-28 NOTE — Transfer of Care (Signed)
Immediate Anesthesia Transfer of Care Note  Patient: Desiree Lee  Procedure(s) Performed: TOTAL LAPAROSCOPIC HYSTERECTOMY WITH SALPINGECTOMY (Bilateral )  Patient Location: PACU  Anesthesia Type:General  Level of Consciousness: drowsy and patient cooperative  Airway & Oxygen Therapy: Patient Spontanous Breathing and Patient connected to face mask oxygen  Post-op Assessment: Report given to RN and Post -op Vital signs reviewed and stable  Post vital signs: Reviewed and stable  Last Vitals:  Vitals Value Taken Time  BP 101/75 01/28/20 1108  Temp 36.6 C 01/28/20 1108  Pulse 69 01/28/20 1109  Resp 14 01/28/20 1109  SpO2 99 % 01/28/20 1109  Vitals shown include unvalidated device data.  Last Pain:  Vitals:   01/28/20 0746  TempSrc: Temporal  PainSc: 0-No pain         Complications: No apparent anesthesia complications

## 2020-01-28 NOTE — Anesthesia Procedure Notes (Signed)
Procedure Name: Intubation Date/Time: 01/28/2020 9:21 AM Performed by: Jonna Clark, CRNA Pre-anesthesia Checklist: Patient identified, Patient being monitored, Timeout performed, Emergency Drugs available and Suction available Patient Re-evaluated:Patient Re-evaluated prior to induction Oxygen Delivery Method: Circle system utilized Preoxygenation: Pre-oxygenation with 100% oxygen Induction Type: IV induction Ventilation: Mask ventilation without difficulty Laryngoscope Size: 3 and McGraph Grade View: Grade I Tube type: Oral Tube size: 7.0 mm Number of attempts: 1 Airway Equipment and Method: Stylet Placement Confirmation: ETT inserted through vocal cords under direct vision,  positive ETCO2 and breath sounds checked- equal and bilateral Secured at: 21 cm Tube secured with: Tape Dental Injury: Teeth and Oropharynx as per pre-operative assessment

## 2020-01-28 NOTE — Op Note (Signed)
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  COMPLICATIONS: None  DISPOSITION: stable to PACU  FINDINGS: Intraabdominal adhesions were not noted. Normal ovaries  PROCEDURE:  The patient was taken to the OR where anesthesia was administed. She was prepped and draped in the normal sterile fashion in the dorsal lithotomy position in the [place]o stirrups. A time out was performed. A Graves speculum was inserted, the cervix was grasped with a single tooth tenaculum and the endometrial cavity was sounded. The cervix was progressively dilated to a size 18 [place] with [employer] dilators. A V-Care uterine manipulator was inserted in the usual fashion without incident. Gloves were changed and attention was turned to the abdomen.   An infraumbilical transverse 28mm skin incision was made with the scalpel after local anesthesia applied to the skin. A Veress-step needle was inserted in the usual fashion and confirmed using the hanging drop technique. A pneumoperitoneum was obtained by insufflation of CO2 (opening pressure of 16mmHg) to 43mmHg. A diagnostic laparoscopy was performed yielding the previously described findings. Attention was turned to the left lower quadrant where after visualization of the inferior epigastric vessels a 78mm skin incision was made with the scalpel. A 5 mm laparoscopic port was inserted. The same procedure was repeated in the right lower quadrant with a 79mm trocar. Attention was turned to the left aspect of the uterus, where after visualization of the ureter, the round ligament was coagulated and transected using the 18mm Harmonic Scapel. The  anterior and posterior leafs of the broad ligament were dissected off as the anterior one was coagulated and transected in a caudal direction towards the cuff of the uterine manipulator.  Attention was then turned to the left fallopian tube which was recognized by visualization of the fimbria. The tube is excised to its attachment to the uterus. The uterine-ovarian ligament and its blood vessels were carefully coagulated and transected using the Harmonic scapel.  Attention was turned to the right aspect of the uterus where the same procedure was performed.  The vesicouterine reflection of the peritoneum was dissected with the harmonic scapel and the bladder flap was created bluntly.  The uterine vessels were coagulated and transected bilaterally using first bipolar cautery and then the harmonic scapel. A 360 degree, circumferential colpotomy was done to completely amputate the uterus with cervix and tubes. Once the specimen was amputated it was delivered through the vagina.   The colpotomy was repaired in a simple running fashion using a delayed absorbable suture with an endo-stitch device.  Vaginal exam confirms complete closure.  The cavity was copiously irrigated. A survey of the pelvic cavity revealed adequate hemostasis and no injury to bowel, bladder, or ureter.   A diagnostic cystoscopy was performed using saline distension of bladder with no lesions or injuries noted.  Bilateral urine flow from each ureteral orifice is visualized.  At this point the procedure was finalized. Right lower quadrant fascia incision is closed with a vicryl suture using the fascia closure device. All the instruments were removed from the patient's body. Gas was expelled and patient is leveled.  Incisions are closed with skin adhesive.    Patient goes to recovery room in stable condition.  All sponge, instrument, and needle  counts are correct x2.     Barnett Applebaum, MD, [facility], [facility] 01/28/2020  11:10 AM

## 2020-01-28 NOTE — Anesthesia Postprocedure Evaluation (Signed)
Anesthesia Post Note  Patient: Undra Mccotter  Procedure(s) Performed: TOTAL LAPAROSCOPIC HYSTERECTOMY WITH SALPINGECTOMY (Bilateral )  Patient location during evaluation: PACU Anesthesia Type: General Level of consciousness: awake and alert and oriented Pain management: pain level controlled Vital Signs Assessment: post-procedure vital signs reviewed and stable Respiratory status: spontaneous breathing Cardiovascular status: blood pressure returned to baseline Anesthetic complications: no     Last Vitals:  Vitals:   01/28/20 1251 01/28/20 1317  BP: 99/74 90/64  Pulse: 72   Resp: 17 18  Temp: (!) 36.1 C   SpO2: 94% 97%    Last Pain:  Vitals:   01/28/20 1317  TempSrc:   PainSc: 6                  Lyndsy Gilberto

## 2020-01-28 NOTE — Discharge Instructions (Addendum)
Total Laparoscopic Hysterectomy, Care After This sheet gives you information about how to care for yourself after your procedure. Your health care provider may also give you more specific instructions. If you have problems or questions, contact your health care provider. What can I expect after the procedure? After the procedure, it is common to have:  Pain and bruising around your incisions.  A sore throat, if a breathing tube was used during surgery.  Fatigue.  Poor appetite.  Less interest in sex. If your ovaries were also removed, it is also common to have symptoms of menopause such as hot flashes, night sweats, and lack of sleep (insomnia). Follow these instructions at home: Bathing  Do not take baths, swim, or use a hot tub until your health care provider approves. You may need to only take showers for 2-3 weeks.  Keep your bandage (dressing) dry until your health care provider says it can be removed. Incision care   Follow instructions from your health care provider about how to take care of your incisions. Make sure you: ? Wash your hands with soap and water before you change your dressing. If soap and water are not available, use hand sanitizer. ? Change your dressing as told by your health care provider. ? Leave stitches (sutures), skin glue, or adhesive strips in place. These skin closures may need to stay in place for 2 weeks or longer. If adhesive strip edges start to loosen and curl up, you may trim the loose edges. Do not remove adhesive strips completely unless your health care provider tells you to do that.  Check your incision area every day for signs of infection. Check for: ? Redness, swelling, or pain. ? Fluid or blood. ? Warmth. ? Pus or a bad smell. Activity  Get plenty of rest and sleep.  Do not lift anything that is heavier than 10 lbs (4.5 kg) for one month after surgery, or as long as told by your health care provider.  Do not drive or use heavy  machinery while taking prescription pain medicine.  Do not drive for 24 hours if you were given a medicine to help you relax (sedative).  Return to your normal activities as told by your health care provider. Ask your health care provider what activities are safe for you. Lifestyle   Do not use any products that contain nicotine or tobacco, such as cigarettes and e-cigarettes. These can delay healing. If you need help quitting, ask your health care provider.  Do not drink alcohol until your health care provider approves. General instructions  Do not douche, use tampons, or have sex for at least 6 weeks, or as told by your health care provider.  Take over-the-counter and prescription medicines only as told by your health care provider.  To monitor yourself for a fever, take your temperature at least once a day during recovery.  If you struggle with physical or emotional changes after your procedure, speak with your health care provider or a therapist.  To prevent or treat constipation while you are taking prescription pain medicine, your health care provider may recommend that you: ? Drink enough fluid to keep your urine clear or pale yellow. ? Take over-the-counter or prescription medicines. ? Eat foods that are high in fiber, such as fresh fruits and vegetables, whole grains, and beans. ? Limit foods that are high in fat and processed sugars, such as fried and sweet foods.  Keep all follow-up visits as told by your health care provider.   This is important. Contact a health care provider if:  You have chills or a fever.  You have redness, swelling, or pain around an incision.  You have fluid or blood coming from an incision.  Your incision feels warm to the touch.  You have pus or a bad smell coming from an incision.  An incision breaks open.  You feel dizzy or light-headed.  You have pain or bleeding when you urinate.  You have diarrhea, nausea, or vomiting that does not  go away.  You have abnormal vaginal discharge.  You have a rash.  You have pain that does not get better with medicine. Get help right away if:  You have a fever and your symptoms suddenly get worse.  You have severe abdominal pain.  You have chest pain.  You have shortness of breath.  You faint.  You have pain, swelling, or redness on your leg.  You have heavy vaginal bleeding with blood clots. Summary  After the procedure it is common to have abdominal pain. Your provider will give you medication for this.  Do not take baths, swim, or use a hot tub until your health care provider approves.  Do not lift anything that is heavier than 10 lbs (4.5 kg) for one month after surgery, or as long as told by your health care provider.  Notify your provider if you have any signs or symptoms of infection after the procedure. This information is not intended to replace advice given to you by your health care provider. Make sure you discuss any questions you have with your health care provider. Document Revised: 08/25/2017 Document Reviewed: 11/23/2016 Elsevier Patient Education  2020 [employer]    AMBULATORY SURGERY  DISCHARGE INSTRUCTIONS   1) The drugs that you were given will stay in your system until tomorrow so for the next 24 hours you should not:  A) Drive an automobile B) Make any legal decisions C) Drink any alcoholic beverage   2) You may resume regular meals tomorrow.  Today it is better to start with liquids and gradually work up to solid foods.  You may eat anything you prefer, but it is better to start with liquids, then soup and crackers, and gradually work up to solid foods.   3) Please notify your doctor immediately if you have any unusual bleeding, trouble breathing, redness and pain at the surgery site, drainage, fever, or pain not relieved by medication.    4) Additional Instructions:        Please contact your physician with any problems  or Same Day Surgery at [phone], Monday through Friday 6 am to 4 pm, or [facility] Main number at [phone]. 

## 2020-01-28 NOTE — Anesthesia Preprocedure Evaluation (Signed)
Anesthesia Evaluation  Patient identified by MRN, date of birth, ID band Patient awake    Reviewed: Allergy & Precautions, NPO status , Patient's Chart, lab work & pertinent test results  History of Anesthesia Complications (+) PONV and history of anesthetic complications  Airway Mallampati: I  TM Distance: >3 FB Neck ROM: Full    Dental  (+) Teeth Intact, Dental Advisory Given   Pulmonary former smoker,    breath sounds clear to auscultation       Cardiovascular negative cardio ROS   Rhythm:Regular Rate:Normal     Neuro/Psych  Headaches, negative psych ROS   GI/Hepatic Neg liver ROS, GERD  ,  Endo/Other  negative endocrine ROS  Renal/GU negative Renal ROS     Musculoskeletal negative musculoskeletal ROS (+)   Abdominal Normal abdominal exam  (+)   Peds  Hematology negative hematology ROS (+)   Anesthesia Other Findings   Reproductive/Obstetrics                             Anesthesia Physical  Anesthesia Plan  ASA: II  Anesthesia Plan: General   Post-op Pain Management:    Induction: Intravenous  PONV Risk Score and Plan: 4 or greater and Ondansetron, Dexamethasone, Midazolam and Scopolamine patch - Pre-op  Airway Management Planned: Oral ETT  Additional Equipment: None  Intra-op Plan:   Post-operative Plan: Extubation in OR  Informed Consent: I have reviewed the patients History and Physical, chart, labs and discussed the procedure including the risks, benefits and alternatives for the proposed anesthesia with the patient or authorized representative who has indicated his/her understanding and acceptance.     Dental advisory given  Plan Discussed with: CRNA  Anesthesia Plan Comments:         Anesthesia Quick Evaluation

## 2020-01-29 ENCOUNTER — Telehealth: Payer: Self-pay

## 2020-01-29 ENCOUNTER — Other Ambulatory Visit: Payer: Self-pay | Admitting: Obstetrics and Gynecology

## 2020-01-29 MED ORDER — HYDROCODONE-ACETAMINOPHEN 5-325 MG PO TABS: 1.0000 | ORAL_TABLET | Freq: Four times a day (QID) | ORAL | 0 refills | Status: DC | PRN

## 2020-01-29 NOTE — Telephone Encounter (Signed)
Change to norco  Also can take over the counter medicine See if dr Glennon Mac can put that in as I am away from [facility] except [facility]iku

## 2020-01-29 NOTE — Telephone Encounter (Signed)
Per AMS Rx sent please ask her to bring in an unused portion of the percocet to dispose of properly at the pharmacy. Pt aware & will take Percocet with her to pharmacy when picking up [place].

## 2020-01-29 NOTE — Progress Notes (Signed)
Postop of Dr.Harris reports allergic reaction for percocet.  Has previously been on Vicodin without issues. Refilled with ask to bring unused portion of percocet to pharmacy for proper disposal.

## 2020-01-29 NOTE — Progress Notes (Signed)
Called patient this AM to check on patient. She stated that her face gets beet red after taking her rx percocet for pain management following surgery yesterday. She states no other issues other than red face but asked her to notify Dr. Kenton Kingfisher office regarding this issue. Pain is controlled.

## 2020-01-29 NOTE — Telephone Encounter (Signed)
Patient reports she had Total Hysterectomy yesterday w/[facility]. She was given Percocet. Her face is getting beat red. [facility] advised her to notify us to see if she needs a different rx. [phone]

## 2020-01-30 LAB — SURGICAL PATHOLOGY

## 2020-01-30 NOTE — Telephone Encounter (Signed)
Please advise 

## 2020-01-30 NOTE — Telephone Encounter (Signed)
This encounter was created in error - please disregard.

## 2020-02-05 ENCOUNTER — Other Ambulatory Visit: Payer: Self-pay

## 2020-02-05 ENCOUNTER — Ambulatory Visit (INDEPENDENT_AMBULATORY_CARE_PROVIDER_SITE_OTHER): Payer: 59 | Admitting: Obstetrics & Gynecology

## 2020-02-05 ENCOUNTER — Encounter: Payer: Self-pay | Admitting: Obstetrics & Gynecology

## 2020-02-05 VITALS — BP 110/70 | Ht 66.0 in | Wt 191.0 lb

## 2020-02-05 DIAGNOSIS — Z90710 Acquired absence of both cervix and uterus: Secondary | ICD-10-CM

## 2020-02-05 NOTE — Progress Notes (Signed)
  Postoperative Follow-up Patient presents post op from [place] for pelvic pain, 1 week ago. Path: DIAGNOSIS:  A. UTERUS AND CERVIX WITH BILATERAL FALLOPIAN TUBES; HYSTERECTOMY WITH  BILATERAL SALPINGECTOMY:  - CERVIX:  - NEGATIVE FOR DYSPLASIA AND MALIGNANCY.  - ENDOMETRIUM:    - PROLIFERATIVE. NEGATIVE FOR ATYPIA / EIN AND MALIGNANCY.  - MYOMETRIUM AND UTERINE SEROSA:    - NO SIGNIFICANT PATHOLOGIC ALTERATION.  - BILATERAL FALLOPIAN TUBES:    - NO SIGNIFICANT PATHOLOGIC ALTERATION.  Subjective: Patient reports some improvement in her preop symptoms. Eating a regular diet without difficulty. Pain is controlled with current analgesics. Medications being used: acetaminophen.  Activity: normal activities of daily living. Patient reports additional symptom's since surgery of No bleeding.  Objective: BP 110/70   Ht 5\' 6"  (1.676 m)   Wt 191 lb (86.6 kg)   LMP 01/08/2020 (Exact Date)   BMI 30.83 kg/m  Physical Exam Constitutional:      General: She is not in acute distress.    Appearance: She is well-developed.  Cardiovascular:     Rate and Rhythm: Normal rate.  Pulmonary:     Effort: Pulmonary effort is normal.  Abdominal:     General: There is no distension.     Palpations: Abdomen is soft.     Tenderness: There is no abdominal tenderness.     Comments: Incision Healing Well   Musculoskeletal:        General: Normal range of motion.  Neurological:     Mental Status: She is alert and oriented to person, place, and time.     Cranial Nerves: No cranial nerve deficit.  Skin:    General: Skin is warm and dry.     Assessment: s/p :  total laparoscopic hysterectomy with bilateral salpingectomy progressing well  Plan: Patient has done well after surgery with no apparent complications.  I have discussed the post-operative course to date, and the expected progress moving forward.  The patient understands what complications to be concerned about.  I will see the  patient in routine follow up, or sooner if needed.    Activity plan: No heavy lifting.Marland Kitchen  Pelvic rest.  Hoyt Koch 02/05/2020, 2:14 PM

## 2020-02-11 ENCOUNTER — Telehealth: Payer: Self-pay

## 2020-02-11 NOTE — Telephone Encounter (Signed)
Spoke w/patient. Advised of my chart message sent in response to her voice mail. She will upload photo for [facility] review.

## 2020-02-11 NOTE — Telephone Encounter (Signed)
Patient had hysterectomy by [facility] about 2 wks ago. She reports her right side incision has some infection on it (top side). It has a little yellow spot. Inquiring what to do to help get that to go away. [phone]

## 2020-03-09 ENCOUNTER — Other Ambulatory Visit: Payer: Self-pay

## 2020-03-09 ENCOUNTER — Ambulatory Visit (INDEPENDENT_AMBULATORY_CARE_PROVIDER_SITE_OTHER): Payer: Self-pay | Admitting: Obstetrics & Gynecology

## 2020-03-09 ENCOUNTER — Encounter: Payer: Self-pay | Admitting: Obstetrics & Gynecology

## 2020-03-09 VITALS — BP 120/80 | Ht 66.0 in | Wt 190.0 lb

## 2020-03-09 DIAGNOSIS — Z90710 Acquired absence of both cervix and uterus: Secondary | ICD-10-CM

## 2020-03-09 NOTE — Progress Notes (Signed)
  Postoperative Follow-up Patient presents post op from [facility] for pelvic pain, 6 weeks ago.  Subjective: Patient reports marked improvement in her preop symptoms. Eating a regular diet without difficulty. The patient is not having any pain.  Activity: normal activities of daily living. Patient reports additional symptom's since surgery of No bleeding  Objective: BP 120/80   Ht 5\' 6"  (1.676 m)   Wt 190 lb (86.2 kg)   LMP 01/08/2020 (Exact Date)   BMI 30.67 kg/m  Physical Exam Constitutional:      General: She is not in acute distress.    Appearance: She is well-developed.  Genitourinary:     Pelvic exam was performed with patient supine.     Vagina and rectum normal.     No vaginal erythema or bleeding.     No right or left adnexal mass present.     Right adnexa not tender.     Left adnexa not tender.     Genitourinary Comments: Cervix and uterus absent. Vaginal cuff healing well.  Cardiovascular:     Rate and Rhythm: Normal rate.  Pulmonary:     Effort: Pulmonary effort is normal.  Abdominal:     General: There is no distension.     Palpations: Abdomen is soft.     Tenderness: There is no abdominal tenderness.     Comments: Incision healing well.  Musculoskeletal:        General: Normal range of motion.  Neurological:     Mental Status: She is alert and oriented to person, place, and time.     Cranial Nerves: No cranial nerve deficit.  Skin:    General: Skin is warm and dry.     Assessment: s/p :  total laparoscopic hysterectomy with bilateral salpingectomy progressing well  Plan: Patient has done well after surgery with no apparent complications.  I have discussed the post-operative course to date, and the expected progress moving forward.  The patient understands what complications to be concerned about.  I will see the patient in routine follow up, or sooner if needed.    Activity plan: No restriction.  Hoyt Koch 03/09/2020, 8:15 AM

## 2020-03-10 ENCOUNTER — Ambulatory Visit: Payer: 59 | Admitting: Obstetrics & Gynecology

## 2020-11-18 ENCOUNTER — Encounter: Payer: BC Managed Care – PPO | Admitting: Dermatology

## 2020-12-05 ENCOUNTER — Encounter (HOSPITAL_COMMUNITY): Payer: Self-pay

## 2020-12-22 ENCOUNTER — Other Ambulatory Visit: Payer: Self-pay | Admitting: Family Medicine

## 2020-12-22 DIAGNOSIS — Z1231 Encounter for screening mammogram for malignant neoplasm of breast: Secondary | ICD-10-CM

## 2020-12-30 IMAGING — MG MM PLC BREAST LOC DEV 1ST LESION INC MAMMO GUIDE*L*
8 series · 8 of 8 positions shown · non-contrast
Comparison: Previous exam(s).

CLINICAL DATA: Radioactive seed localization was requested of the
left breast prior to excision a biopsy-proven radial scar with usual
ductal hyperplasia.

EXAM:
MAMMOGRAPHIC GUIDED RADIOACTIVE SEED LOCALIZATION OF THE LEFT BREAST

[L CC (1 of 4)]
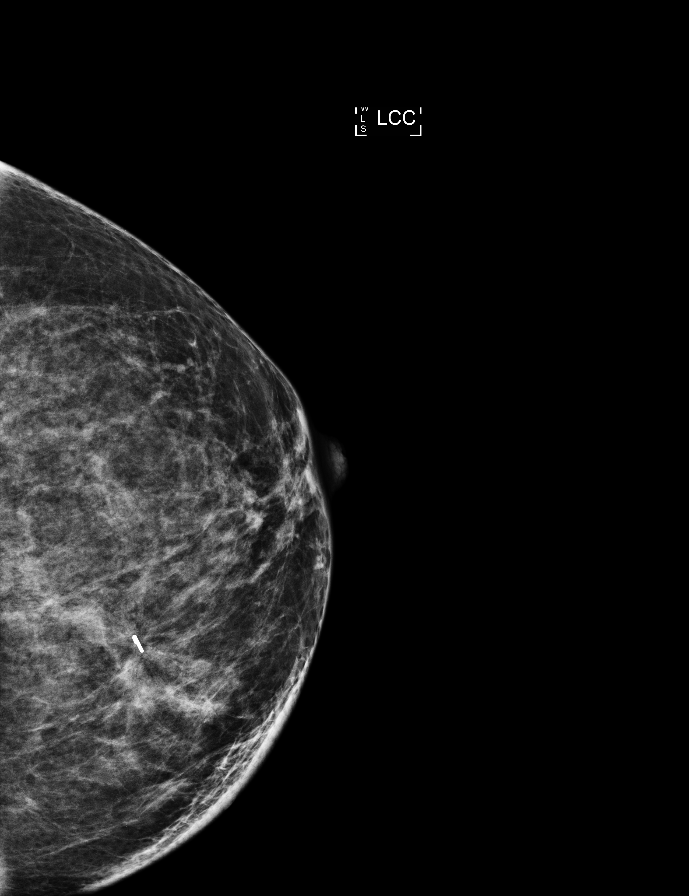

[L ML (1 of 4)]
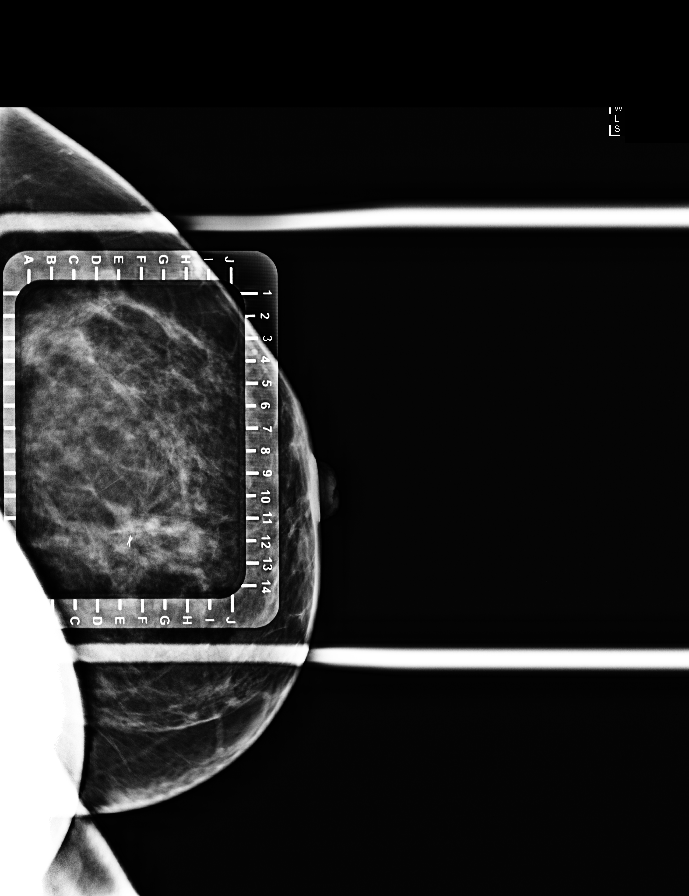

[L ML (2 of 4)]
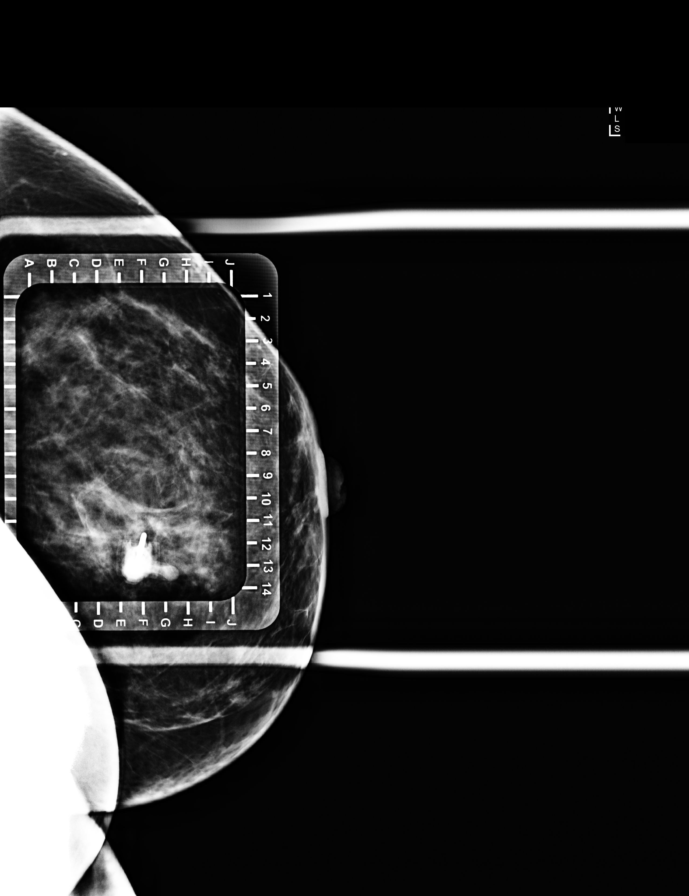

[L CC (2 of 4)]
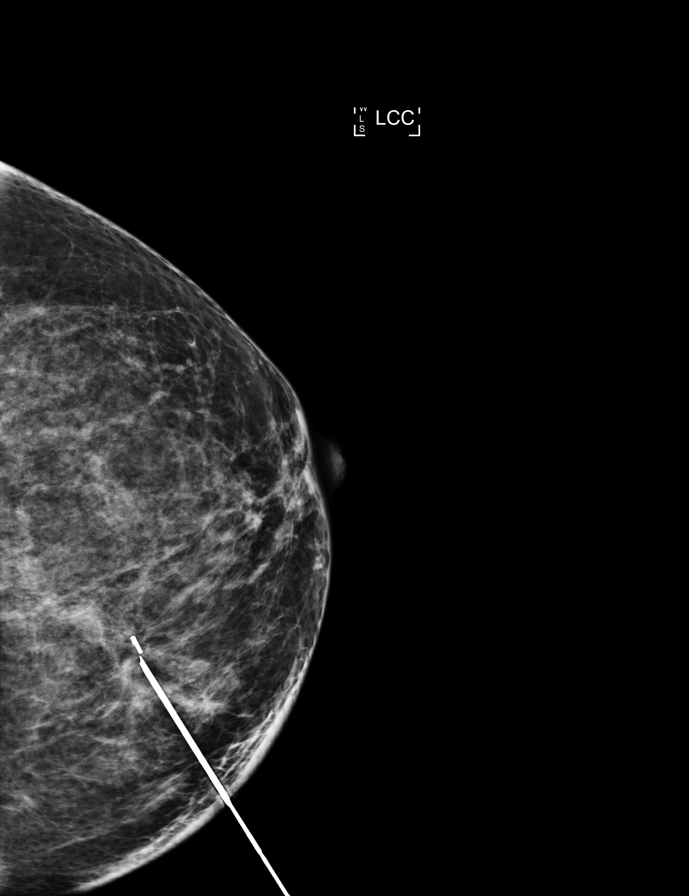

[L ML (3 of 4)]
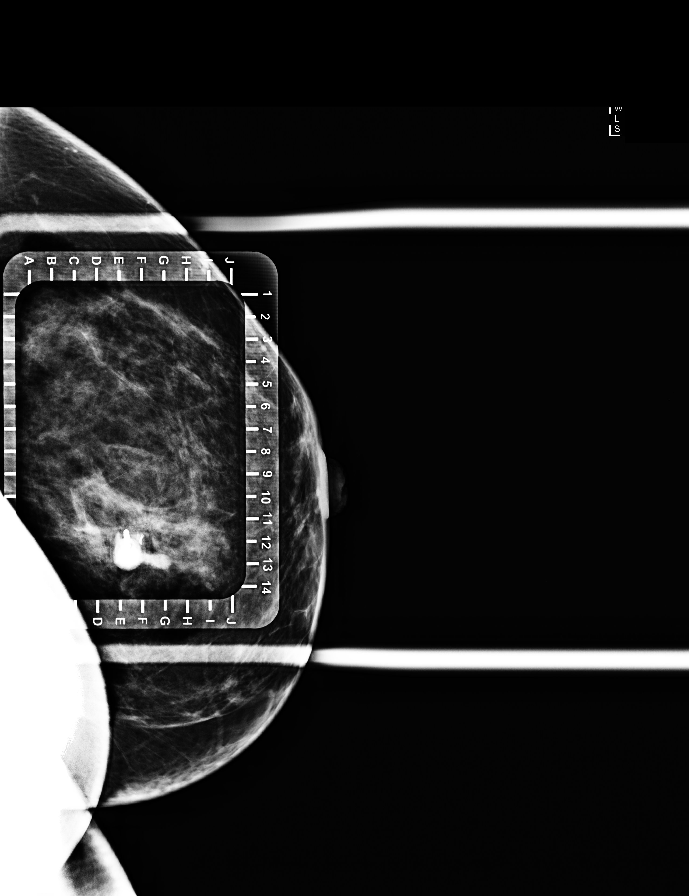

[L ML (4 of 4)]
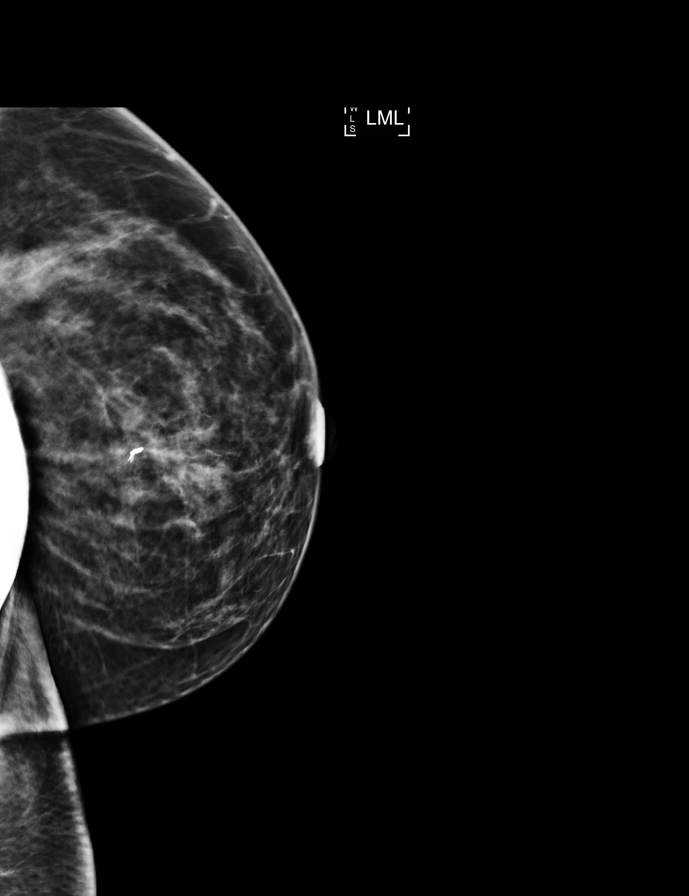

[L CC (3 of 4)]
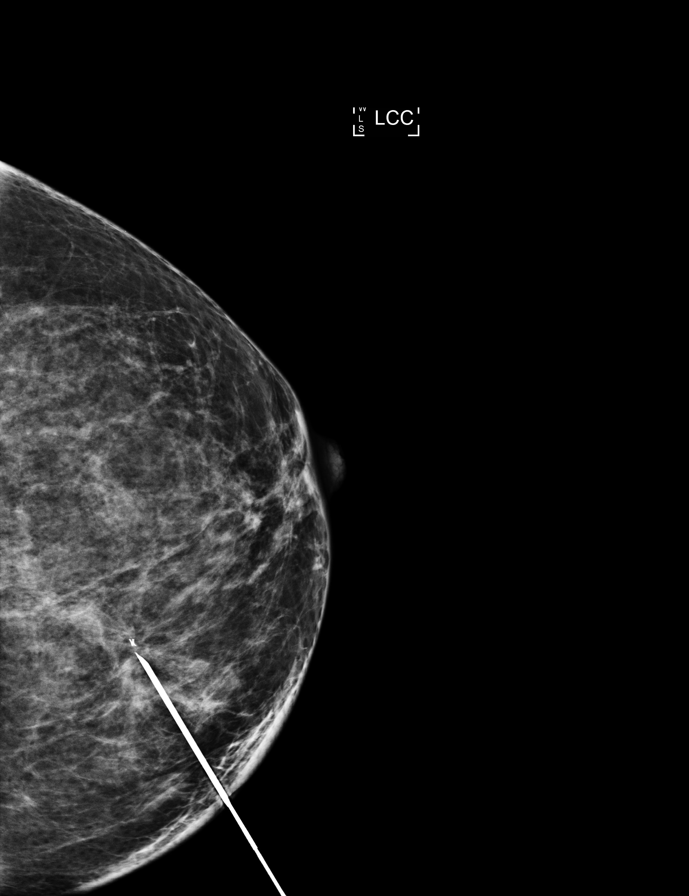

[L CC (4 of 4)]
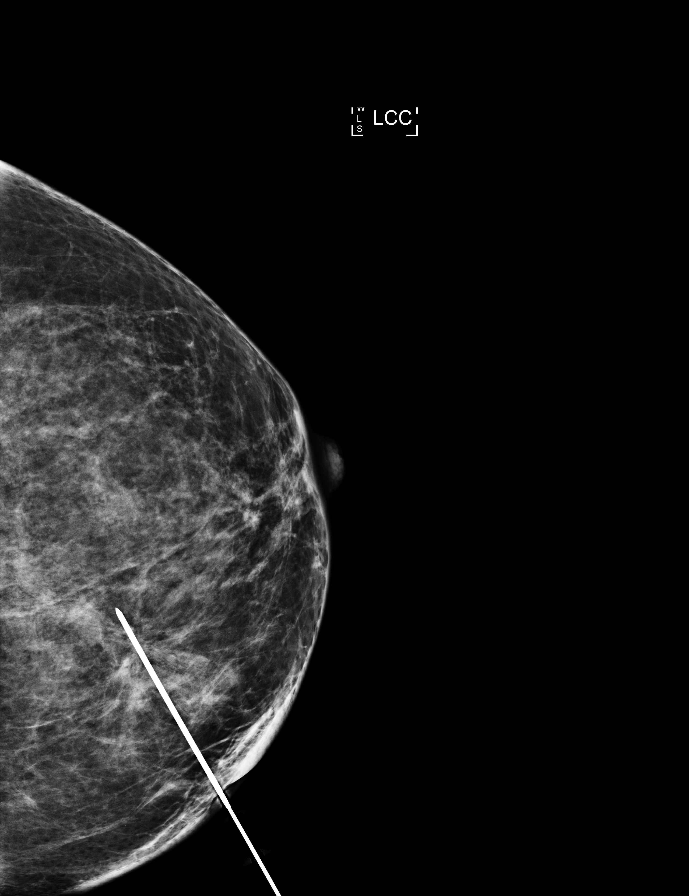

[8 of 8 positions shown; findings below may reference images not displayed]

FINDINGS: Patient presents for radioactive seed localization prior to
excisional biopsy. I met with the patient and we discussed the
procedure of seed localization including benefits and alternatives.
We discussed the high likelihood of a successful procedure. We
discussed the risks of the procedure including infection, bleeding,
tissue injury and further surgery. We discussed the low dose of
radioactivity involved in the procedure. Informed, written consent
was given.

The usual time-out protocol was performed immediately prior to the
procedure.

Using mammographic guidance, sterile technique, 1% lidocaine and an
K-[id number] radioactive seed, area of architectural distortion with
associated X shaped biopsy clip was localized using a medial
approach. The follow-up mammogram images confirm the seed in the
expected location and were marked for Dr. Kinfat.

Follow-up survey of the patient confirms presence of the radioactive
seed.

Order number of K-ON7 seed:  [id number].

Total activity: 0.247 mCi reference Date: 09 December, 2019

The patient tolerated the procedure well and was released from the
[REDACTED]. She was given instructions regarding seed removal.
IMPRESSION: Radioactive seed localization left breast. No apparent
complications.

## 2021-01-06 ENCOUNTER — Other Ambulatory Visit: Payer: Self-pay

## 2021-01-06 ENCOUNTER — Ambulatory Visit
Admission: RE | Admit: 2021-01-06 | Discharge: 2021-01-06 | Disposition: A | Discharge status: Home / Self Care | Payer: BC Managed Care – PPO | Source: Ambulatory Visit | Attending: Family Medicine | Admitting: Family Medicine

## 2021-01-06 DIAGNOSIS — Z1231 Encounter for screening mammogram for malignant neoplasm of breast: Secondary | ICD-10-CM | POA: Diagnosis not present

## 2021-01-20 ENCOUNTER — Other Ambulatory Visit: Payer: Self-pay

## 2021-01-20 ENCOUNTER — Encounter: Payer: Self-pay | Admitting: Obstetrics & Gynecology

## 2021-01-20 ENCOUNTER — Ambulatory Visit (INDEPENDENT_AMBULATORY_CARE_PROVIDER_SITE_OTHER): Payer: BC Managed Care – PPO | Admitting: Obstetrics & Gynecology

## 2021-01-20 VITALS — BP 122/70 | HR 100 | Ht 66.0 in | Wt 210.0 lb

## 2021-01-20 DIAGNOSIS — Z01419 Encounter for gynecological examination (general) (routine) without abnormal findings: Secondary | ICD-10-CM | POA: Diagnosis not present

## 2021-01-20 DIAGNOSIS — N809 Endometriosis, unspecified: Secondary | ICD-10-CM | POA: Diagnosis not present

## 2021-01-20 NOTE — Progress Notes (Signed)
HPI:      [name] is a 45 y.o. who LMP was Patient's last menstrual period was 01/08/2020 (exact date). as she had TLH last year for pain and mild cervical dysplasia in prior PAP but normal cervix on pathology examination, she presents today for her annual examination. The patient has no complaints today. The patient is sexually active. 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 The patient does perform self breast exams.  There is notable family history of breast or ovarian cancer in her family.  The patient has regular exercise: yes.  The patient denies current symptoms of depression.    GYN History: Contraception: status post hysterectomy  PMHx: Past Medical History:  Diagnosis Date  . Chronic sinusitis   . Deviated nasal septum   . Family history of breast cancer    4/21 cancer genetic testing letter sent  . GERD (gastroesophageal reflux disease)   . Headache    sinus and migraines   . Motion sickness    reading in cars  . PONV (postoperative nausea and vomiting)    also, low BP, wakes up slow   Past Surgical History:  Procedure Laterality Date  . BREAST BIOPSY Left 10/09/2019   affirm bx, x marker, radial scar and ususal ductal hyperplasia  . BREAST LUMPECTOMY Left 12/19/2019   CSL, UDH, no atypia or malignancy  . BREAST LUMPECTOMY WITH RADIOACTIVE SEED LOCALIZATION Left 12/19/2019   Procedure: LEFT BREAST LUMPECTOMY WITH RADIOACTIVE SEED LOCALIZATION;  Surgeon: Jovita Kussmaul, MD;  Location: [facility];  Service: General;  Laterality: Left;  . IMAGE GUIDED SINUS SURGERY N/A 01/29/2016   Procedure: IMAGE GUIDED SINUS SURGERY;  Surgeon: Beverly Gust, MD;  Location: [facility];  Service: ENT;  Laterality: N/A;  . LAPAROSCOPY  2009   for endometriosis  . MAXILLARY ANTROSTOMY Bilateral 01/29/2016   Procedure: ENDOSCOPIC MAXILLARY ANTROSTOMY WITH TISSUE;  Surgeon: Beverly Gust, MD;   Location: [facility];  Service: ENT;  Laterality: Bilateral;  . NASAL TURBINATE REDUCTION Bilateral 01/29/2016   Procedure: TURBINATE REDUCTION/SUBMUCOSAL RESECTION and bilateral SMR middle turbinates.;  Surgeon: Beverly Gust, MD;  Location: [facility];  Service: ENT;  Laterality: Bilateral;  . OTOPLASTY  1983  . SEPTOPLASTY N/A 01/29/2016   Procedure: SEPTOPLASTY;  Surgeon: Beverly Gust, MD;  Location: [facility];  Service: ENT;  Laterality: N/A;  DISK GAVE DISK TO Desiree Lee 4-06 KP  . TOTAL LAPAROSCOPIC HYSTERECTOMY WITH SALPINGECTOMY Bilateral 01/28/2020   Procedure: TOTAL LAPAROSCOPIC HYSTERECTOMY WITH SALPINGECTOMY;  Surgeon: Gae Dry, MD;  Location: [facility];  Service: Gynecology;  Laterality: Bilateral;  NEED RFNA  . TUBAL LIGATION  01/21/14   Family History  Problem Relation Age of Onset  . Breast cancer Maternal Aunt 36   Social History   Tobacco Use  . Smoking status: Former Smoker    Quit date: 09/30/2005    Years since quitting: 15.3  . Smokeless tobacco: Never Used  Substance Use Topics  . Alcohol use: No  . Drug use: Never    Current Outpatient Medications:  .  escitalopram (LEXAPRO) 10 MG tablet, Take 1 tablet by mouth daily., Disp: , Rfl:  .  fluticasone (FLONASE) 50 MCG/ACT nasal spray, Place 2 sprays into both nostrils at bedtime as needed for allergies or rhinitis. , Disp: , Rfl:  .  metFORMIN (GLUCOPHAGE-XR) 500 MG 24 hr tablet, Take by mouth., Disp: , Rfl:  .  montelukast (SINGULAIR)  10 MG tablet, Take by mouth., Disp: , Rfl:  .  propranolol (INDERAL) 20 MG tablet, Take by mouth., Disp: , Rfl:  .  Vitamin D, Ergocalciferol, (DRISDOL) 1.25 MG (50000 UNIT) CAPS capsule, Take 50,000 Units by mouth every Tuesday at 6 PM., Disp: , Rfl:  .  levocetirizine (XYZAL) 5 MG tablet, Take 5 mg by mouth at bedtime.  (Patient not taking: No sig reported), Disp: , Rfl:  .  phentermine 15 MG capsule, Take 15 mg by mouth every morning. (Patient not  taking: Reported on 01/20/2021), Disp: , Rfl:  Allergies: Bactrim [sulfamethoxazole-trimethoprim]  Review of Systems  Constitutional: Negative for chills, fever and malaise/fatigue.  HENT: Negative for congestion, sinus pain and sore throat.   Eyes: Negative for blurred vision and pain.  Respiratory: Negative for cough and wheezing.   Cardiovascular: Negative for chest pain and leg swelling.  Gastrointestinal: Negative for abdominal pain, constipation, diarrhea, heartburn, nausea and vomiting.  Genitourinary: Negative for dysuria, frequency, hematuria and urgency.  Musculoskeletal: Negative for back pain, joint pain, myalgias and neck pain.  Skin: Negative for itching and rash.  Neurological: Negative for dizziness, tremors and weakness.  Endo/Heme/Allergies: Does not bruise/bleed easily.  Psychiatric/Behavioral: Negative for depression. The patient is not nervous/anxious and does not have insomnia.     Objective: BP 122/70 (Cuff Size: Normal)   Pulse 100   Ht 5\' 6"  (1.676 m)   Wt 210 lb (95.3 kg)   LMP 01/08/2020 (Exact Date)   BMI 33.89 kg/m   Filed Weights   01/20/21 0808  Weight: 210 lb (95.3 kg)   Body mass index is 33.89 kg/m. Physical Exam Constitutional:      General: She is not in acute distress.    Appearance: She is well-developed.  Genitourinary:     Vulva, bladder and rectum normal.     No lesions in the vagina.     Genitourinary Comments: Vaginal cuff well healed     No vaginal bleeding.      Right Adnexa: not tender and no mass present.    Left Adnexa: not tender and no mass present.    Cervix is absent.     Uterus is absent.  Breasts:     Right: No mass, skin change or tenderness.     Left: No mass, skin change or tenderness.    HENT:     Head: Normocephalic and atraumatic. No laceration.     Right Ear: Hearing normal.     Left Ear: Hearing normal.     Mouth/Throat:     Pharynx: Uvula midline.  Eyes:     Pupils: Pupils are equal, round, and  reactive to light.  Neck:     Thyroid: No thyromegaly.  Cardiovascular:     Rate and Rhythm: Normal rate and regular rhythm.     Heart sounds: No murmur heard. No friction rub. No gallop.   Pulmonary:     Effort: Pulmonary effort is normal. No respiratory distress.     Breath sounds: Normal breath sounds. No wheezing.  Abdominal:     General: Bowel sounds are normal. There is no distension.     Palpations: Abdomen is soft.     Tenderness: There is no abdominal tenderness. There is no rebound.  Musculoskeletal:        General: Normal range of motion.     Cervical back: Normal range of motion and neck supple.  Neurological:     Mental Status: She is alert and oriented to person,  place, and time.     Cranial Nerves: No cranial nerve deficit.  Skin:    General: Skin is warm and dry.  Psychiatric:        Judgment: Judgment normal.  Vitals reviewed.     Assessment:  ANNUAL EXAM 1. Women's annual routine gynecological examination   2. Endometriosis      Screening Plan:            1.  Vaginal Screening-  Pap smear schedule reviewed with patient  2. Breast screening- Exam annually and mammogram>40 planned   3. Colonoscopy every 10 years, Hemoccult testing - after age 63  4. Labs managed by PCP   5. Rec Ca and Vit D daily for prevention of osteoporosis Also rec Kegels for prevention of GSI Weight loss options dicussed     F/U  Return in about 1 year (around 01/20/2022) for Annual.  Barnett Applebaum, MD, [facility], [facility] 01/20/2021  8:28 AM

## 2021-01-20 NOTE — Patient Instructions (Addendum)
PAP every 5 years after hysterectomy Mammogram every year Labs yearly (with PCP) Calcium and Vitamin D daily  Thank you for choosing [facility]. As part of our ongoing efforts to improve patient experience, we would appreciate your feedback. Please fill out the short survey that you will receive by mail or MyChart. Your opinion is important to [place]! - Dr. Kenton Kingfisher   Kegel Exercises  Kegel exercises can help strengthen your pelvic floor muscles. The pelvic floor is a group of muscles that support your rectum, small intestine, and bladder. In females, pelvic floor muscles also help support the womb (uterus). These muscles help you control the flow of urine and stool. Kegel exercises are painless and simple, and they do not require any equipment. Your provider may suggest Kegel exercises to:  Improve bladder and bowel control.  Improve sexual response.  Improve weak pelvic floor muscles after surgery to remove the uterus (hysterectomy) or pregnancy (females).  Improve weak pelvic floor muscles after prostate gland removal or surgery (males). Kegel exercises involve squeezing your pelvic floor muscles, which are the same muscles you squeeze when you try to stop the flow of urine or keep from passing gas. The exercises can be done while sitting, standing, or lying down, but it is best to vary your position. Exercises How to do Kegel exercises: 1. Squeeze your pelvic floor muscles tight. You should feel a tight lift in your rectal area. If you are a female, you should also feel a tightness in your vaginal area. Keep your stomach, buttocks, and legs relaxed. 2. Hold the muscles tight for up to 10 seconds. 3. Breathe normally. 4. Relax your muscles. 5. Repeat as told by your health care provider. Repeat this exercise daily as told by your health care provider. Continue to do this exercise for at least 4-6 weeks, or for as long as told by your health care provider. You may be referred to a  physical therapist who can help you learn more about how to do Kegel exercises. Depending on your condition, your health care provider may recommend:  Varying how long you squeeze your muscles.  Doing several sets of exercises every day.  Doing exercises for several weeks.  Making Kegel exercises a part of your regular exercise routine. This information is not intended to replace advice given to you by your health care provider. Make sure you discuss any questions you have with your health care provider. Document Revised: 01/17/2020 Document Reviewed: 05/02/2018 Elsevier Patient Education  [employer].

## 2021-06-08 ENCOUNTER — Ambulatory Visit (INDEPENDENT_AMBULATORY_CARE_PROVIDER_SITE_OTHER): Payer: 59 | Admitting: Obstetrics & Gynecology

## 2021-06-08 ENCOUNTER — Encounter: Payer: Self-pay | Admitting: Obstetrics & Gynecology

## 2021-06-08 ENCOUNTER — Other Ambulatory Visit: Payer: Self-pay

## 2021-06-08 VITALS — BP 120/80 | Ht 66.0 in | Wt 210.0 lb

## 2021-06-08 DIAGNOSIS — N3001 Acute cystitis with hematuria: Secondary | ICD-10-CM

## 2021-06-08 DIAGNOSIS — Z780 Asymptomatic menopausal state: Secondary | ICD-10-CM

## 2021-06-08 DIAGNOSIS — R300 Dysuria: Secondary | ICD-10-CM

## 2021-06-08 LAB — POCT URINALYSIS DIPSTICK
Bilirubin, UA: NEGATIVE
Glucose, UA: NEGATIVE
Ketones, UA: NEGATIVE
Nitrite, UA: NEGATIVE
Protein, UA: NEGATIVE
Spec Grav, UA: 1.01 (ref 1.010–1.025)
Urobilinogen, UA: 0.2 E.U./dL
pH, UA: 5 (ref 5.0–8.0)

## 2021-06-08 MED ORDER — ESTRADIOL 0.1 MG/24HR TD PTWK
0.1000 mg | MEDICATED_PATCH | TRANSDERMAL | 11 refills | Status: DC
Start: 2021-06-08 — End: 2021-10-05

## 2021-06-08 MED ORDER — CEFADROXIL 500 MG PO CAPS: 500.0000 mg | ORAL_CAPSULE | Freq: Two times a day (BID) | ORAL | 0 refills | Status: DC

## 2021-06-08 NOTE — Progress Notes (Signed)
HPI:      [name] is a 45 y.o. [phone] who is perimenopausal, presents today for a problem visit (2 problems).  First, she complains of decreased libido, dry skin, hot flashes, moodiness, no energy, vaginal dryness.   Symptoms have been present for 6 months. Symptoms are mod to severe and has led her to come in today to seek options for intervention.  Previous Treatment: None.  No OTC meds.  She is sexually active, although has low sex drive now as well as dryness and pain. Prior hysterectomy.  Denies PostMenopausal Bleeding  2. Pt also c/o 5 day h/o dysuria, freq, urgency.  Azo has helped, and testing (OTC) suggestive of UTI.  PMHx: She  has a past medical history of Chronic sinusitis, Deviated nasal septum, Family history of breast cancer, GERD (gastroesophageal reflux disease), Headache, Motion sickness, and PONV (postoperative nausea and vomiting). Also,  has a past surgical history that includes Otoplasty (1983); laparoscopy (2009); Tubal ligation (01/21/14); Septoplasty (N/A, 01/29/2016); Nasal turbinate reduction (Bilateral, 01/29/2016); Maxillary antrostomy (Bilateral, 01/29/2016); Image guided sinus surgery (N/A, 01/29/2016); Breast lumpectomy with radioactive seed localization (Left, 12/19/2019); Total laparoscopic hysterectomy with salpingectomy (Bilateral, 01/28/2020); Breast biopsy (Left, 10/09/2019); and Breast lumpectomy (Left, 12/19/2019)., family history includes Breast cancer (age of onset: 46) in her maternal aunt.,  reports that she quit smoking about 15 years ago. Her smoking use included cigarettes. She has never used smokeless tobacco. She reports that she does not drink alcohol and does not use drugs.  She has a current medication list which includes the following prescription(s): cefadroxil, estradiol, vitamin d (ergocalciferol), escitalopram, fluticasone, levocetirizine, metformin, montelukast, phentermine, and propranolol. Also, is allergic to bactrim  [sulfamethoxazole-trimethoprim].  Review of Systems  Constitutional:  Positive for malaise/fatigue. Negative for chills and fever.  HENT:  Negative for congestion, sinus pain and sore throat.   Eyes:  Negative for blurred vision and pain.  Respiratory:  Negative for cough and wheezing.   Cardiovascular:  Negative for chest pain and leg swelling.  Gastrointestinal:  Negative for abdominal pain, constipation, diarrhea, heartburn, nausea and vomiting.  Genitourinary:  Positive for dysuria, frequency and urgency. Negative for hematuria.  Musculoskeletal:  Negative for back pain, joint pain, myalgias and neck pain.  Skin:  Negative for itching and rash.  Neurological:  Negative for dizziness, tremors and weakness.  Endo/Heme/Allergies:  Does not bruise/bleed easily.  Psychiatric/Behavioral:  Negative for depression. The patient is not nervous/anxious and does not have insomnia.    Objective: BP 120/80   Ht '5\' 6"'$  (1.676 m)   Wt 210 lb (95.3 kg)   LMP 01/08/2020 (Exact Date)   BMI 33.89 kg/m  Physical Exam Constitutional:      General: She is not in acute distress.    Appearance: She is well-developed.  Musculoskeletal:        General: Normal range of motion.  Neurological:     Mental Status: She is alert and oriented to person, place, and time.  Skin:    General: Skin is warm and dry.  Vitals reviewed.   Results for orders placed or performed in visit on 06/08/21  POCT urinalysis dipstick  Result Value Ref Range   Color, UA     Clarity, UA     Glucose, UA Negative Negative   Bilirubin, UA Negative    Ketones, UA Negative    Spec Grav, UA 1.010 1.010 - 1.025   Blood, UA Moderate (2+)    pH, UA 5.0 5.0 - 8.0  Protein, UA Negative Negative   Urobilinogen, UA 0.2 0.2 or 1.0 E.U./dL   Nitrite, UA Negative    Leukocytes, UA Moderate (2+) (A) Negative   Appearance     Odor       ASSESSMENT/PLAN:    ICD-10-CM   1. Dysuria  R30.0 POCT urinalysis dipstick    Urine Culture     2. Acute cystitis with hematuria  N30.01 Urine Culture    cefadroxil (DURICEF) 500 MG capsule    3. Menopause  Z78.0 estradiol (CLIMARA - DOSED IN MG/24 HR) 0.1 mg/24hr patch    HRT I have discussed HRT with the patient in detail.  The risk/benefits of it were reviewed.  She understands that during menopause Estrogen decreases dramatically and that this results in an increased risk of cardiovascular disease as well as osteoporosis.  We have also discussed the fact that hot flashes often result from a decrease in Estrogen, and that by replacing Estrogen, they can often be alleviated.  We have discussed skin, vaginal and urinary tract changes that may also take place from this drop in Estrogen.  Emotional changes have also been linked to Estrogen and we have briefly discussed this.  The benefits of HRT including decrease in hot flashes, vaginal dryness, and osteoporosis were discussed.  The emotional benefit and a possible change in her cardiovascular risk profile was also reviewed.  The risks associated with Hormone Replacement Therapy were also reviewed.  The use of unopposed Estrogen and its relationship to endometrial cancer was discussed.  The addition of Progesterone and its beneficial effect on endometrial cancer was also noted.  The fact that there has been no consistent definitive studies showing an increase in breast cancer in women who use HRT was discussed with the patient.  The possible side effects including breast tenderness, fluid retention, mood changes and vaginal bleeding were discussed.  The patient was informed that this is an elective medication and that she may choose not to take Hormone Replacement Therapy.  Literature on HRT was given, and I believe that after answering all of the patient's questions, she has an adequate and informed understanding of HRT.  Special emphasis on the WHI study, as well as several studies since that pertaining to the risks and benefits of estrogen  replacement therapy were compared.  The possible limitations of these studies were discussed including the age stratification of the WHI study.  The possible role of Progesterone in these studies was discussed in detail.  I believe that the patient has an informed knowledge of the risks and benefits of HRT.  I have specifically discussed WHI findings and current updates.  Different type of hormone formulation and methods of taking hormone replacement therapy discussed.   Patch therapy w estrogen to address most if not all sx's of menopause.  Assess in 6-8 weeks.  Adjust dose or type as necessary.  Treat UTI and monitor for recurrence.  Culture to assess resistence.  Monitor for s/sx yeast infection.  A total of 30 minutes were spent face-to-face with the patient as well as preparation, review, communication, and documentation during this encounter.   Barnett Applebaum, MD, [facility], [facility] 06/08/2021  9:31 AM

## 2021-06-08 NOTE — Patient Instructions (Signed)
Estradiol Skin Patches What is this medication? ESTRADIOL (es tra DYE ole) reduces the number and severity of hot flashes due to menopause. It may also help relieve the symptoms of menopause, such as vaginal irritation, dryness, or pain during sex. It can also be used to prevent osteoporosis after menopause. It works by increasing levels of the hormone estrogen in the body. This medication is an estrogen hormone. This medicine may be used for other purposes; ask your health care provider or pharmacist if you have questions. COMMON BRAND NAME(S): Alora, Climara, DOTTI, Esclim, Estraderm, FemPatch, LYLLANA, Menostar, Minivelle, Vivelle, Vivelle-Dot What should I tell my care team before I take this medication? They need to know if you have any of these conditions: Abnormal vaginal bleeding Blood vessel disease or blood clots Breast, cervical, endometrial, ovarian, liver, or uterine cancer Dementia Diabetes (high blood sugar) Gallbladder disease Heart disease or recent heart attack High blood pressure High cholesterol High levels of calcium in the blood Hysterectomy Kidney disease Liver disease Low thyroid levels Lupus Migraine headaches Protein C/S deficiency Smoke tobacco cigarettes Stroke An unusual or allergic reaction to estrogens, other medications, foods, dyes, or preservatives Pregnant or trying to get pregnant Breast-feeding How should I use this medication? This medication is for external use only. Use it as directed on the prescription label. Apply the patch, sticky side to the skin, to an area that is clean, dry and hairless. Do not cut or trim the patch. Do not wear more than 1 patch at a time. Remove the old patch before using a new patch. Use a different site each time to prevent skin irritation. Keep using it unless your care team tells you to stop. This medication comes with INSTRUCTIONS FOR USE. Ask your pharmacist for directions on how to use this medication. Read the  information carefully. Talk to your pharmacist or care team if you have questions. A patient package insert for the product will be given with each prescription and refill. Be sure to read this information carefully each time. Talk to your care team about the use of this medication in children. Special care may be needed. Overdosage: If you think you have taken too much of this medicine contact a poison control center or emergency room at once. NOTE: This medicine is only for you. Do not share this medicine with others. What if I miss a dose? If you miss a dose, apply it as soon as you can. If it is almost time for your next dose, apply only that dose. Do not apply double or extra doses. What may interact with this medication? Do not take this medication with any of the following: Aromatase inhibitors like aminoglutethimide, anastrozole, exemestane, letrozole, testolactone This medication may also interact with the following: Carbamazepine Certain antibiotics like erythromycin or clarithromycin Certain antiviral medications for HIV or hepatitis Certain medications for fungal infections like ketoconazole, itraconazole, or posaconazole Medications for fungus infections like itraconazole and ketoconazole Phenobarbital Raloxifene Rifampin St. John's Wort Tamoxifen This list may not describe all possible interactions. Give your health care provider a list of all the medicines, herbs, non-prescription drugs, or dietary supplements you use. Also tell them if you smoke, drink alcohol, or use illegal drugs. Some items may interact with your medicine. What should I watch for while using this medication? Visit your care team for regular checks on your progress. You will need a regular breast and pelvic exam and Pap smear while on this medication. You should also discuss the need for regular  mammograms with your care team, and follow his or her guidelines for these tests. This medication can make your  body retain fluid, making your fingers, hands, or ankles swell. Your blood pressure can go up. Contact your care team if you feel you are retaining fluid. If you have any reason to think you are pregnant, stop taking this medication right away and contact your care team. Smoking increases the risk of getting a blood clot or having a stroke while you are taking this medication, especially if you are more than 46 years old. You are strongly advised not to smoke. If you wear contact lenses and notice visual changes, or if the lenses begin to feel uncomfortable, consult your eye care specialist. This medication can increase the risk of developing a condition (endometrial hyperplasia) that may lead to cancer of the lining of the uterus. Taking progestins, another hormone medication, with this medication lowers the risk of developing this condition. Therefore, if your uterus has not been removed (by a hysterectomy), your care team may prescribe a progestin for you to take together with your estrogen. You should know, however, that taking estrogens with progestins may have additional health risks. You should discuss the use of estrogens and progestins with your care team to determine the benefits and risks for you. If you are going to need surgery, an MRI, CT scan, or other procedure, tell your care team that you are using this medication. You may need to remove the patch before the procedure. Contact with water while you are swimming, using a sauna, bathing, or showering may cause the patch to fall off. If your patch falls off reapply it. If you cannot reapply the patch, apply a new patch to another area and continue to follow your usual dose schedule. What side effects may I notice from receiving this medication? Side effects that you should report to your care team as soon as possible: Allergic reactions-skin rash, itching, hives, swelling of the face, lips, tongue, or throat Blood clot-pain, swelling, or  warmth in the leg, shortness of breath, chest pain Breast tissue changes, new lumps, redness, pain, or discharge from the nipple Gallbladder problems-severe stomach pain, nausea, vomiting, fever Increase in blood pressure Liver injury-right upper belly pain, loss of appetite, nausea, light-colored stool, dark yellow or brown urine, yellowing skin or eyes, unusual weakness or fatigue Stroke-sudden numbness or weakness of the face, arm, or leg, trouble speaking, confusion, trouble walking, loss of balance or coordination, dizziness, severe headache, change in vision Unusual vaginal discharge, itching, or odor Vaginal bleeding after menopause, pelvic pain Side effects that usually do not require medical attention (report to your care team if they continue or are bothersome): Bloating Breast pain or tenderness Hair loss Nausea Stomach pain Vomiting This list may not describe all possible side effects. Call your doctor for medical advice about side effects. You may report side effects to FDA at 1-800-FDA-1088. Where should I keep my medication? Keep out of the reach of children and pets. Store at room temperature between 20 and 25 degrees C (68 and 77 degrees F). Keep this medication in the original pouch until you are ready to use it. Get rid of any unused medication after the expiration date. Get rid of used patches properly. Since used patches may still contain active medication, fold the patch in half so that it sticks to itself before throwing it away. Put it in the trash where children and pets cannot reach it. It is important to get  rid of the medication as soon as you no longer need it, or it is expired. You can do this in two ways: Take the medication to a medication take-back program. Check with your pharmacy or law enforcement to find a location. If you cannot return the medication, ask your pharmacist or care team how to get rid of this medication safely. NOTE: This sheet is a summary.  It may not cover all possible information. If you have questions about this medicine, talk to your doctor, pharmacist, or health care provider.  2022 [employer] (2020-10-09 12:29:33)

## 2021-06-10 ENCOUNTER — Other Ambulatory Visit: Payer: Self-pay | Admitting: Obstetrics & Gynecology

## 2021-06-10 MED ORDER — URIBEL 118 MG PO CAPS: 1.0000 | ORAL_CAPSULE | Freq: Three times a day (TID) | ORAL | 6 refills | Status: DC | PRN

## 2021-06-13 LAB — URINE CULTURE

## 2021-08-09 ENCOUNTER — Ambulatory Visit: Payer: 59 | Admitting: Obstetrics & Gynecology

## 2021-08-30 ENCOUNTER — Emergency Department: Payer: 59

## 2021-08-30 ENCOUNTER — Emergency Department
Admission: EM | Admit: 2021-08-30 | Discharge: 2021-08-30 | Disposition: A | Discharge status: Home / Self Care | Payer: 59 | Attending: Emergency Medicine | Admitting: Emergency Medicine

## 2021-08-30 ENCOUNTER — Other Ambulatory Visit: Payer: Self-pay

## 2021-08-30 DIAGNOSIS — M546 Pain in thoracic spine: Secondary | ICD-10-CM | POA: Diagnosis not present

## 2021-08-30 DIAGNOSIS — M25571 Pain in right ankle and joints of right foot: Secondary | ICD-10-CM | POA: Diagnosis not present

## 2021-08-30 DIAGNOSIS — Z87891 Personal history of nicotine dependence: Secondary | ICD-10-CM | POA: Insufficient documentation

## 2021-08-30 DIAGNOSIS — R55 Syncope and collapse: Secondary | ICD-10-CM | POA: Insufficient documentation

## 2021-08-30 DIAGNOSIS — M545 Low back pain, unspecified: Secondary | ICD-10-CM | POA: Diagnosis not present

## 2021-08-30 DIAGNOSIS — R0781 Pleurodynia: Secondary | ICD-10-CM | POA: Insufficient documentation

## 2021-08-30 LAB — CBC
HCT: 39.6 % (ref 36.0–46.0)
Hemoglobin: 13.9 g/dL (ref 12.0–15.0)
MCH: 30.2 pg (ref 26.0–34.0)
MCHC: 35.1 g/dL (ref 30.0–36.0)
MCV: 85.9 fL (ref 80.0–100.0)
Platelets: 177 10*3/uL (ref 150–400)
RBC: 4.61 MIL/uL (ref 3.87–5.11)
RDW: 11.9 % (ref 11.5–15.5)
WBC: 6 10*3/uL (ref 4.0–10.5)
nRBC: 0 % (ref 0.0–0.2)

## 2021-08-30 LAB — COMPREHENSIVE METABOLIC PANEL
ALT: 60 U/L — ABNORMAL HIGH (ref 0–44)
AST: 43 U/L — ABNORMAL HIGH (ref 15–41)
Albumin: 4.2 g/dL (ref 3.5–5.0)
Alkaline Phosphatase: 84 U/L (ref 38–126)
Anion gap: 5 (ref 5–15)
BUN: 12 mg/dL (ref 6–20)
CO2: 25 mmol/L (ref 22–32)
Calcium: 9.5 mg/dL (ref 8.9–10.3)
Chloride: 106 mmol/L (ref 98–111)
Creatinine, Ser: 0.59 mg/dL (ref 0.44–1.00)
GFR, Estimated: >60 mL/min (ref >60)
Glucose, Bld: 106 mg/dL — ABNORMAL HIGH (ref 70–99)
Potassium: 4.1 mmol/L (ref 3.5–5.1)
Sodium: 136 mmol/L (ref 135–145)
Total Bilirubin: 0.7 mg/dL (ref 0.3–1.2)
Total Protein: 7.7 g/dL (ref 6.5–8.1)

## 2021-08-30 LAB — TROPONIN I (HIGH SENSITIVITY): Troponin I (High Sensitivity): <2 ng/L (ref <18)

## 2021-08-30 NOTE — ED Triage Notes (Signed)
Pt states on Saturday she had syncopal episode, is having some left rib soreness and right ankle pain. Pt denies hx of syncope but states has had some eye twitching since and increased weakness.

## 2021-08-30 NOTE — ED Provider Notes (Signed)
[facility] Emergency Department Provider Note    ____________________________________________   Event Date/Time   First MD Initiated Contact with Patient 08/30/21 1516     (approximate)  I have reviewed the triage vital signs and the nursing notes.   HISTORY  Chief Complaint Fall   HPI Desiree Lee is a 45 y.o. female, history of sinusitis and GERD, presents to the emergency department for evaluation of syncope.  Patient states that on Saturday she experienced a syncopal episode while walking down the stairs.  Her husband witnessed the event and said that she fell down a few stairs but does not believe that she hit her head.  He states that she landed on her left side and there was no seizure-like activity. She woke up and was reoriented within a couple minutes.  She denies any symptoms preceding the episode. She is currently endorsing some mild pain along her left rib, right ankle pain, and pain along her spine.  She states that she did not feel like she needed to be evaluated at the time, but says that she has been worrying about whether or not there is a life-threatening cause for her syncopal episode.  Denies fever/chills, chest pain, shortness of breath, abdominal pain, or urinary symptoms.  History limited by: No limitations  Past Medical History:  Diagnosis Date   Chronic sinusitis    Deviated nasal septum    Family history of breast cancer    4/21 cancer genetic testing letter sent   GERD (gastroesophageal reflux disease)    Headache    sinus and migraines    Motion sickness    reading in cars   PONV (postoperative nausea and vomiting)    also, low BP, wakes up slow    Patient Active Problem List   Diagnosis Date Noted   S/P laparoscopic hysterectomy 02/05/2020   Pelvic pain    Post endometrial ablation syndrome    Endometriosis 01/22/2020   Menorrhagia with regular cycle 01/22/2020   Dysmenorrhea 01/22/2020   ASCUS of cervix with negative  high risk HPV 01/22/2020   Radial scar of left breast 01/20/2020    Past Surgical History:  Procedure Laterality Date   BREAST BIOPSY Left 10/09/2019   affirm bx, x marker, radial scar and ususal ductal hyperplasia   BREAST LUMPECTOMY Left 12/19/2019   CSL, UDH, no atypia or malignancy   BREAST LUMPECTOMY WITH RADIOACTIVE SEED LOCALIZATION Left 12/19/2019   Procedure: LEFT BREAST LUMPECTOMY WITH RADIOACTIVE SEED LOCALIZATION;  Surgeon: Jovita Kussmaul, MD;  Location: [place];  Service: General;  Laterality: Left;   IMAGE GUIDED SINUS SURGERY N/A 01/29/2016   Procedure: IMAGE GUIDED SINUS SURGERY;  Surgeon: Beverly Gust, MD;  Location: [place];  Service: ENT;  Laterality: N/A;   LAPAROSCOPY  2009   for endometriosis   MAXILLARY ANTROSTOMY Bilateral 01/29/2016   Procedure: ENDOSCOPIC MAXILLARY ANTROSTOMY WITH TISSUE;  Surgeon: Beverly Gust, MD;  Location: [place];  Service: ENT;  Laterality: Bilateral;   NASAL TURBINATE REDUCTION Bilateral 01/29/2016   Procedure: TURBINATE REDUCTION/SUBMUCOSAL RESECTION and bilateral SMR middle turbinates.;  Surgeon: Beverly Gust, MD;  Location: [place];  Service: ENT;  Laterality: Bilateral;   OTOPLASTY  1983   SEPTOPLASTY N/A 01/29/2016   Procedure: SEPTOPLASTY;  Surgeon: Beverly Gust, MD;  Location: [place];  Service: ENT;  Laterality: N/A;  DISK GAVE DISK TO TABITHA 4-06 KP   TOTAL LAPAROSCOPIC HYSTERECTOMY WITH SALPINGECTOMY Bilateral 01/28/2020   Procedure: TOTAL  LAPAROSCOPIC HYSTERECTOMY WITH SALPINGECTOMY;  Surgeon: Gae Dry, MD;  Location: [facility];  Service: Gynecology;  Laterality: Bilateral;  NEED RFNA   TUBAL LIGATION  01/21/14    Prior to Admission medications   Medication Sig Start Date End Date Taking? Authorizing Provider  cefadroxil (DURICEF) 500 MG capsule Take 1 capsule (500 mg total) by mouth 2 (two) times daily. 06/08/21   Gae Dry, MD  escitalopram  (LEXAPRO) 10 MG tablet Take 1 tablet by mouth daily. Patient not taking: Reported on 06/08/2021 01/02/21   [provider]  estradiol (CLIMARA - DOSED IN MG/24 HR) 0.1 mg/24hr patch Place 1 patch (0.1 mg total) onto the skin once a week. 06/08/21   Gae Dry, MD  levocetirizine (XYZAL) 5 MG tablet Take 5 mg by mouth at bedtime.  Patient not taking: Reported on 06/08/2021    [provider]  metFORMIN (GLUCOPHAGE-XR) 500 MG 24 hr tablet Take by mouth. Patient not taking: Reported on 06/08/2021 01/06/21   [provider]  Meth-Hyo-M Bl-Na Phos-Ph Sal (URIBEL) 118 MG CAPS Take 1 capsule (118 mg total) by mouth 3 (three) times daily as needed. 06/10/21   Gae Dry, MD  montelukast (SINGULAIR) 10 MG tablet Take by mouth. Patient not taking: Reported on 06/08/2021 09/15/20 09/15/21  [provider]  propranolol (INDERAL) 20 MG tablet Take by mouth. 05/06/20 05/06/21  [provider]  Vitamin D, Ergocalciferol, (DRISDOL) 1.25 MG (50000 UNIT) CAPS capsule Take 50,000 Units by mouth every Tuesday at 6 PM. 12/30/19   [provider]    Allergies Bactrim [sulfamethoxazole-trimethoprim]  Family History  Problem Relation Age of Onset   Breast cancer Maternal Aunt 40    Social History Social History   Tobacco Use   Smoking status: Former    Types: Cigarettes    Quit date: 09/30/2005    Years since quitting: 15.9   Smokeless tobacco: Never  Substance Use Topics   Alcohol use: No   Drug use: Never    Review of Systems  Constitutional: Negative for fever/chills, weight loss, or fatigue.  Eyes: Negative for visual changes or discharge.  ENT: Negative for congestion, hearing changes, or sore throat.  Gastrointestinal: Negative for abdominal pain, nausea/vomiting, or diarrhea.  Genitourinary: Negative for dysuria or hematuria.  Musculoskeletal: Positive for low and mid back pain.  Negative for joint pain.  Skin: Negative for rashes or  lesions.  Neurological: Negative for headache, syncope, dizziness, tremors, or numbness/tingling.   10-point ROS otherwise negative. ____________________________________________   PHYSICAL EXAM:  VITAL SIGNS: ED Triage Vitals [08/30/21 1406]  Enc Vitals Group     BP (!) 142/102     Pulse Rate 85     Resp 20     Temp 97.9 F (36.6 C)     Temp Source Oral     SpO2 100 %     Weight 210 lb (95.3 kg)     Height 5\' 6"  (1.676 m)     Head Circumference      Peak Flow      Pain Score 7     Pain Loc      Pain Edu?      Excl. in [place]?     Physical Exam Constitutional:      Appearance: Normal appearance.  HENT:     Head: Normocephalic and atraumatic.     Nose: Nose normal.     Mouth/Throat:     Mouth: Mucous membranes are moist.  Pharynx: Oropharynx is clear. No posterior oropharyngeal erythema.  Eyes:     Extraocular Movements: Extraocular movements intact.     Conjunctiva/sclera: Conjunctivae normal.     Pupils: Pupils are equal, round, and reactive to light.  Cardiovascular:     Rate and Rhythm: Normal rate and regular rhythm.  Pulmonary:     Effort: Pulmonary effort is normal.     Breath sounds: Normal breath sounds.  Abdominal:     General: Abdomen is flat.     Palpations: Abdomen is soft.  Musculoskeletal:        General: Normal range of motion.     Comments: No deformities or tenderness in upper or lower extremities.  Pulse, motor, sensation intact.  No swelling or tenderness in the right ankle.  Mild tenderness in the eighth/ninth ribs left side.  There is some endorsement of tenderness when palpating the thoracic and lumbar spine.   Skin:    General: Skin is warm and dry.  Neurological:     General: No focal deficit present.     Mental Status: She is alert and oriented to person, place, and time.     Comments: Cranial nerves II through XII intact.  Cerebellar function intact.  Patient was able to ambulate in a straight line without difficulty.  No sensory  deficits.  Sensation intact in all extremities.  Psychiatric:        Mood and Affect: Mood normal.        Behavior: Behavior normal.        Thought Content: Thought content normal.        Judgment: Judgment normal.     Comments: She appears anxious     ____________________________________________    LABS  (all labs ordered are listed, but only abnormal results are displayed)  Labs Reviewed  COMPREHENSIVE METABOLIC PANEL - Abnormal; Notable for the following components:      Result Value   Glucose, Bld 106 (*)    AST 43 (*)    ALT 60 (*)    All other components within normal limits  CBC  TROPONIN I (HIGH SENSITIVITY)     ____________________________________________   EKG Sinus rhythm, rate of 82, no ST segment changes, no AV blocks, no axis deviations.   ____________________________________________    RADIOLOGY I personally viewed and evaluated these images as part of my medical decision making, as well as reviewing the written report by the radiologist.  ED Provider Interpretation: I agree with the radiologist interpretation.   DG Thoracic Spine 4V  Result Date: 08/30/2021 CLINICAL DATA:  Back pain EXAM: THORACIC SPINE - 4+ VIEW COMPARISON:  None. FINDINGS: No recent fracture is seen. Alignment of posterior margins of vertebral bodies is unremarkable. Small bony spurs seen in the lower thoracic spine. Paraspinal soft tissues are unremarkable. Azygous fissure is seen in the right upper lung fields. IMPRESSION: No significant radiographic abnormality is seen in the thoracic spine. Electronically Signed   By: Elmer Picker M.D.   On: 08/30/2021 16:45   DG Lumbar Spine Complete  Result Date: 08/30/2021 CLINICAL DATA:  Back pain EXAM: LUMBAR SPINE - COMPLETE 4+ VIEW COMPARISON:  None. FINDINGS: No recent fracture is seen. Anterior bony spurs seen at multiple levels, more so at L4-L5 level. There is disc space narrowing at L4-L5 and L5-S1 levels, more so at L4-L5  level. Paraspinal soft tissues are unremarkable. IMPRESSION: No recent fracture is seen. Degenerative changes are noted in the lumbar spine, particularly severe at L4-L5 level. Electronically Signed  By: Elmer Picker M.D.   On: 08/30/2021 16:43    ____________________________________________   PROCEDURES  Procedures   Medications - No data to display  Critical Care performed: No  ____________________________________________   INITIAL IMPRESSION / ASSESSMENT AND PLAN / ED COURSE  Pertinent labs & imaging results that were available during my care of the patient were reviewed by me and considered in my medical decision making (see chart for details).       Desiree Lee is a 45 y.o. female, history of sinusitis and GERD, presents to the emergency department for evaluation of syncope.  Patient states that on Saturday she experienced a syncopal episode while walking down the stairs.  Her husband witnessed the event and said that she fell down a few stairs but does not believe that she hit her head.  He states that she landed on her left side and there was no seizure-like activity. She woke up and was reoriented within a couple minutes.  She denies any symptoms preceding the episode. She is currently endorsing some mild pain along her left rib, right ankle pain, and pain along her spine.  She states that she did not feel like she needed to be evaluated at the time, but says that she has been worrying about whether or not there is a life-threatening cause for her syncopal episode.  Denies fever/chills, chest pain, shortness of breath, abdominal pain, or urinary symptoms.  Differentials include, but not limited to: Serious: arrhythmias, aortic stenosis, hypertrophic cardiomyopathy, seizure, GI bleeding, pulmonary embolus, myocardial infarction, aortic dissection, CVA adrenal insufficiency, ectopic pregnancy, sepsis Common/Non-emergent: vasovagal response, orthostatic hypotension,  hypoglycemia, anemia, hyperventilation, alcohol/drugs.   Upon presentation, patient appears nervous.  Physical exam notable for some endorsement of spinal tenderness when palpating the thoracic and lumbar spine region.  Mild tenderness in the eighth/ninth ribs on the left side.  No other pertinent findings on physical exam.  Upon further discussion with the patient, she states that she is not concerned about her ribs or her ankle as she previously described. These injuries are currently healing well and she currently feels very little pain. However,she is concerned about injuries to her spine as her spine is still hurting her and states that she would feel better if they were imaged.   Initial work-up is unremarkable.  CBC shows no leukocytosis or anemia.  CMP unremarkable for any electrolyte deficiencies.  Denies melena initial troponin less than 2.  EKG shows sinus rhythm with no ST segment changes.  Vital signs are currently stable.  Given the patient's history and physical exam, I believe her syncopal episode was likely due to vasovagal response versus orthostatic hypotension as opposed to any life-threatening pathology.  I discussed my assessment with the patient.  She initially requested that we get a CT scan of her head to evaluate the cause of her syncope.  I discussed with her the lack of utility of the CT scan at this point and that it was unlikely to identify any cause for her syncope, especially since there are no active neurological symptoms and there was no reported head injury involved with her fall, per the witness.  She eventually agreed to forego CT scan but requested contact information for a neurologist in case her symptoms persisted.  We will plan to discharge the patient with anticipatory guidance and strict return precautions.  No further treatment or work-up indicated in the emergency department at this time.        Clinical Course as of  08/30/21 1818  Mon Aug 30, 2021  1711  DG Lumbar Spine Complete [RK]    Clinical Course User Index [RK] Lavonia Drafts, MD    ____________________________________________   FINAL CLINICAL IMPRESSION(S) / ED DIAGNOSES  Final diagnoses:  Syncope, unspecified syncope type     NEW MEDICATIONS STARTED DURING THIS VISIT:  ED Discharge Orders     None        Note:  This document was prepared using Dragon voice recognition software and may include unintentional dictation errors.    Teodoro Spray, [place] 08/30/21 1818    Lavonia Drafts, MD 08/30/21 [phone]

## 2021-10-04 ENCOUNTER — Other Ambulatory Visit: Payer: Self-pay

## 2021-10-04 ENCOUNTER — Ambulatory Visit (INDEPENDENT_AMBULATORY_CARE_PROVIDER_SITE_OTHER): Payer: 59 | Admitting: Dermatology

## 2021-10-04 DIAGNOSIS — L2089 Other atopic dermatitis: Secondary | ICD-10-CM | POA: Diagnosis not present

## 2021-10-04 MED ORDER — CLOBETASOL PROPIONATE 0.05 % EX OINT: 1.0000 "application " | TOPICAL_OINTMENT | Freq: Every day | CUTANEOUS | 1 refills | Status: DC

## 2021-10-04 MED ORDER — OPZELURA 1.5 % EX CREA: 1.0000 "application " | TOPICAL_CREAM | Freq: Every day | CUTANEOUS | 3 refills | Status: DC

## 2021-10-04 NOTE — Patient Instructions (Signed)

## 2021-10-04 NOTE — Progress Notes (Signed)
° °  Follow-Up Visit   Subjective  Desiree Lee is a 46 y.o. female who presents for the following: Rash (Right ring finger x years but has been worse in the last 3 months). Accompanied by daughter  The following portions of the chart were reviewed this encounter and updated as appropriate:   Tobacco   Allergies   Meds   Problems   Med Hx   Surg Hx   Fam Hx      Review of Systems:  No other skin or systemic complaints except as noted in HPI or Assessment and Plan.  Objective  Well appearing patient in no apparent distress; mood and affect are within normal limits.  A focused examination was performed including right hand. Relevant physical exam findings are noted in the Assessment and Plan.  Right Proximal 4th Finger Pinkness and scale        Assessment & Plan  Atopic dermatitis Right Proximal 4th Finger Chronic; persistent and flared. Atopic dermatitis (eczema) is a chronic, relapsing, pruritic condition that can significantly affect quality of life. It is often associated with allergic rhinitis and/or asthma and can require treatment with topical medications, phototherapy, or in severe cases biologic injectable medication (Dupixent; Adbry) or Oral JAK inhibitors.  Start: Opzelura cream qd 7 days per week,  Clobetasol oint qd x 2 weeks then decrease to 5 times per week  Ruxolitinib Phosphate (OPZELURA) 1.5 % CREA - Right Proximal 4th Finger Apply 1 application topically daily. clobetasol ointment (TEMOVATE) 0.05 % - Right Proximal 4th Finger Apply 1 application topically daily. For 2 weeks then decrease to 5 times per week  Return in about 1 month (around 11/04/2021) for Atopic Dermatitis.  I, Ashok Cordia, CMA, am acting as scribe for Sarina Ser, MD . Documentation: I have reviewed the above documentation for accuracy and completeness, and I agree with the above.  Sarina Ser, MD

## 2021-10-05 ENCOUNTER — Other Ambulatory Visit: Payer: Self-pay | Admitting: Obstetrics & Gynecology

## 2021-10-05 ENCOUNTER — Encounter: Payer: Self-pay | Admitting: Dermatology

## 2021-10-05 DIAGNOSIS — Z780 Asymptomatic menopausal state: Secondary | ICD-10-CM

## 2021-10-05 MED ORDER — ESTRADIOL 0.1 MG/24HR TD PTWK: 0.1000 mg | MEDICATED_PATCH | TRANSDERMAL | 3 refills | Status: DC

## 2021-11-11 ENCOUNTER — Ambulatory Visit: Payer: 59 | Admitting: Dermatology

## 2021-12-29 ENCOUNTER — Other Ambulatory Visit: Payer: Self-pay | Admitting: Family Medicine

## 2021-12-29 DIAGNOSIS — Z1231 Encounter for screening mammogram for malignant neoplasm of breast: Secondary | ICD-10-CM

## 2022-02-02 ENCOUNTER — Ambulatory Visit
Admission: RE | Admit: 2022-02-02 | Discharge: 2022-02-02 | Disposition: A | Discharge status: Home / Self Care | Payer: 59 | Source: Ambulatory Visit | Attending: Family Medicine | Admitting: Family Medicine

## 2022-02-02 DIAGNOSIS — Z1231 Encounter for screening mammogram for malignant neoplasm of breast: Secondary | ICD-10-CM | POA: Insufficient documentation

## 2022-05-09 ENCOUNTER — Emergency Department: Payer: 59

## 2022-05-09 ENCOUNTER — Other Ambulatory Visit: Payer: Self-pay

## 2022-05-09 ENCOUNTER — Emergency Department
Admission: EM | Admit: 2022-05-09 | Discharge: 2022-05-09 | Disposition: A | Discharge status: Home / Self Care | Payer: 59 | Attending: Student | Admitting: Student

## 2022-05-09 DIAGNOSIS — R0789 Other chest pain: Secondary | ICD-10-CM | POA: Insufficient documentation

## 2022-05-09 DIAGNOSIS — S199XXA Unspecified injury of neck, initial encounter: Secondary | ICD-10-CM | POA: Diagnosis present

## 2022-05-09 DIAGNOSIS — Y92410 Unspecified street and highway as the place of occurrence of the external cause: Secondary | ICD-10-CM | POA: Diagnosis not present

## 2022-05-09 DIAGNOSIS — M25552 Pain in left hip: Secondary | ICD-10-CM | POA: Insufficient documentation

## 2022-05-09 DIAGNOSIS — S161XXA Strain of muscle, fascia and tendon at neck level, initial encounter: Secondary | ICD-10-CM | POA: Diagnosis not present

## 2022-05-09 DIAGNOSIS — S0990XA Unspecified injury of head, initial encounter: Secondary | ICD-10-CM | POA: Insufficient documentation

## 2022-05-09 DIAGNOSIS — M7918 Myalgia, other site: Secondary | ICD-10-CM

## 2022-05-09 MED ORDER — METHOCARBAMOL 500 MG PO TABS: 500.0000 mg | ORAL_TABLET | Freq: Four times a day (QID) | ORAL | 0 refills | Status: DC

## 2022-05-09 MED ORDER — KETOROLAC TROMETHAMINE 30 MG/ML IJ SOLN
30.0000 mg | Freq: Once | INTRAMUSCULAR | Status: AC
Start: 2022-05-09 — End: 2022-05-09
  Administered 2022-05-09: 30 mg via INTRAMUSCULAR
  Filled 2022-05-09: qty 1

## 2022-05-09 MED ORDER — METHOCARBAMOL 500 MG PO TABS
500.0000 mg | ORAL_TABLET | Freq: Once | ORAL | Status: AC
  Administered 2022-05-09: 500 mg via ORAL
  Filled 2022-05-09: qty 1

## 2022-05-09 MED ORDER — MELOXICAM 15 MG PO TABS
15.0000 mg | ORAL_TABLET | Freq: Every day | ORAL | 0 refills | Status: AC
Start: 2022-05-09 — End: 2022-05-24

## 2022-05-09 MED ORDER — ACETAMINOPHEN 500 MG PO TABS
1000.0000 mg | ORAL_TABLET | Freq: Once | ORAL | Status: AC
  Administered 2022-05-09: 1000 mg via ORAL
  Filled 2022-05-09: qty 2
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
## 2022-05-09 NOTE — Discharge Instructions (Signed)
Please take Tylenol and meloxicam as needed for mild to moderate pain.  You may use methocarbamol as needed for muscle relaxation.  Work on gentle stretching exercises.  If no improvement in 1 week follow-up with PCP or orthopedist to schedule follow-up appointment.  Return to the ER for any worsening symptoms or any urgent changes in health
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
## 2022-06-16 ENCOUNTER — Ambulatory Visit: Payer: 59 | Admitting: Dermatology

## 2022-06-16 DIAGNOSIS — L219 Seborrheic dermatitis, unspecified: Secondary | ICD-10-CM | POA: Diagnosis not present

## 2022-06-16 DIAGNOSIS — L821 Other seborrheic keratosis: Secondary | ICD-10-CM | POA: Diagnosis not present

## 2022-06-16 DIAGNOSIS — D229 Melanocytic nevi, unspecified: Secondary | ICD-10-CM

## 2022-06-16 DIAGNOSIS — R21 Rash and other nonspecific skin eruption: Secondary | ICD-10-CM | POA: Diagnosis not present

## 2022-06-16 DIAGNOSIS — Z808 Family history of malignant neoplasm of other organs or systems: Secondary | ICD-10-CM

## 2022-06-16 DIAGNOSIS — L570 Actinic keratosis: Secondary | ICD-10-CM | POA: Diagnosis not present

## 2022-06-16 MED ORDER — KETOCONAZOLE 2 % EX SHAM: MEDICATED_SHAMPOO | CUTANEOUS | 6 refills | Status: AC

## 2022-06-16 MED ORDER — CLOBETASOL PROPIONATE 0.05 % EX FOAM: CUTANEOUS | 3 refills | Status: DC

## 2022-06-16 NOTE — Progress Notes (Signed)
Follow-Up Visit   Subjective  Desiree Lee is a 46 y.o. female who presents for the following: Rash (Pt c/o rash on the L 4th finger that has been intermittent but chronic and persistent over the past 20 years. She was originally prescribed Clobetasol ointment and Opzelura which does help improve the rash. She was seen by Dr. Posey Pronto at [place] for joint pain in the hands, wrists, ankles, hands, and low back. Patient had labs drawn and was diagnosed with seronegative spondyloarthropathy. Pt states that Dr. Posey Pronto recommended she follow up with dermatology to r/o psoriasis). Patient hasn't noticed nail pitting or rash elsewhere on the body except for the scalp which is itchy and tender at times.    The following portions of the chart were reviewed this encounter and updated as appropriate:   Tobacco  Allergies  Meds  Problems  Med Hx  Surg Hx  Fam Hx      Review of Systems:  No other skin or systemic complaints except as noted in HPI or Assessment and Plan.  Objective  Well appearing patient in no apparent distress; mood and affect are within normal limits.  A focused examination was performed including the face and hands. Relevant physical exam findings are noted in the Assessment and Plan.  Mid frontal hairline x 1 Erythematous thin papules/macules with gritty scale.   L 4th finger Scaly erythematous plaque  Scalp Pink patches with greasy scale.     Assessment & Plan  AK (actinic keratosis) Mid frontal hairline x 1  Actinic keratoses are precancerous spots that appear secondary to cumulative UV radiation exposure/sun exposure over time. They are chronic with expected duration over 1 year. A portion of actinic keratoses will progress to squamous cell carcinoma of the skin. It is not possible to reliably predict which spots will progress to skin cancer and so treatment is recommended to prevent development of skin cancer.  Recommend daily broad spectrum  sunscreen SPF 30+ to sun-exposed areas, reapply every 2 hours as needed.  Recommend staying in the shade or wearing long sleeves, sun glasses (UVA+UVB protection) and wide brim hats (4-inch brim around the entire circumference of the hat). Call for new or changing lesions.   Destruction of lesion - Mid frontal hairline x 1 Complexity: simple   Destruction method: cryotherapy   Informed consent: discussed and consent obtained   Timeout:  patient name, date of birth, surgical site, and procedure verified Lesion destroyed using liquid nitrogen: Yes   Region frozen until ice ball extended beyond lesion: Yes   Outcome: patient tolerated procedure well with no complications   Post-procedure details: wound care instructions given   Additional details:  Prior to procedure, discussed risks of blister formation, small wound, skin dyspigmentation, or rare scar following cryotherapy. Recommend Vaseline ointment to treated areas while healing.   Rash and other nonspecific skin eruption L 4th finger  Chronic and persistent condition with duration or expected duration over one year. Condition is symptomatic/ bothersome to patient. Not currently at goal.  Favor eczematous dermatitis by exam, though psoriasis is on the differential  Patient c/o stiffness in the morning which improves throughout the day - cannot definitively diagnose condition as psoriasis based on today's exam as it is more consistent with eczema. No psoriasis nail changes reported.  Continue Opzelura cream to aa's BID and Clobetasol but will switch to foam and use QD PRN flares that don't improve with Opzelura alone. Topical steroids (such as triamcinolone, fluocinolone, fluocinonide, mometasone, clobetasol,  halobetasol, betamethasone, hydrocortisone) can cause thinning and lightening of the skin if they are used for too long in the same area. Your physician has selected the right strength medicine for your problem and area affected on the  body. Please use your medication only as directed by your physician to prevent side effects.   Patient to watch for nail pitting or rash elsewhere on the body and return for biopsy should that occur.   Recommend gentle skin care.   clobetasol (OLUX) 0.05 % topical foam - L 4th finger Apply to aa's scalp and finger QD PRN. Avoid applying to face, groin, and axilla. Use as directed. Long-term use can cause thinning of the skin.  Seborrheic dermatitis Scalp  Chronic and persistent condition with duration or expected duration over one year. Condition is bothersome/symptomatic for patient. Currently flared.  Seborrheic Dermatitis  -  is a chronic persistent rash characterized by pinkness and scaling most commonly of the mid face but also can occur on the scalp (dandruff), ears; mid chest, mid back and groin.  It tends to be exacerbated by stress and cooler weather.  People who have neurologic disease may experience new onset or exacerbation of existing seborrheic dermatitis.  The condition is not curable but treatable and can be controlled.  Start Ketoconazole 2% shampoo massage into scalp let sit 5-10 minutes then wash off. Use 3d/wk.   Start Clobetasol foam to aa's scalp QD PRN. Topical steroids (such as triamcinolone, fluocinolone, fluocinonide, mometasone, clobetasol, halobetasol, betamethasone, hydrocortisone) can cause thinning and lightening of the skin if they are used for too long in the same area. Your physician has selected the right strength medicine for your problem and area affected on the body. Please use your medication only as directed by your physician to prevent side effects.    ketoconazole (NIZORAL) 2 % shampoo - Scalp Shampoo into scalp let sit 5-10 minutes then wash out. Use 3 days per week.  Seborrheic Keratoses - Stuck-on, waxy, tan-brown papules and/or plaques  - Benign-appearing - Discussed benign etiology and prognosis. - Observe - Call for any  changes  Melanocytic Nevi - Tan-brown and/or pink-flesh-colored symmetric macules and papules - Benign appearing on exam today - Observation - Call clinic for new or changing moles - Recommend daily use of broad spectrum spf 30+ sunscreen to sun-exposed areas.   Family history of skin cancer - what type(s): unsure - who affected: mother, father  Return for appointment as scheduled for eczema, AK, and seb derm follow up.  Luther Redo, CMA, am acting as scribe for Forest Gleason, MD .  Documentation: I have reviewed the above documentation for accuracy and completeness, and I agree with the above.  Forest Gleason, MD

## 2022-06-16 NOTE — Patient Instructions (Addendum)
Gentle Skin Care Guide  1. Bathe no more than once a day.  2. Avoid bathing in hot water  3. Use a mild soap like Dove, Vanicream, Cetaphil, CeraVe. Can use Lever 2000 or Cetaphil antibacterial soap  4. Use soap only where you need it. On most days, use it under your arms, between your legs, and on your feet. Let the water rinse other areas unless visibly dirty.  5. When you get out of the bath/shower, use a towel to gently blot your skin dry, don't rub it.  6. While your skin is still a little damp, apply a moisturizing cream such as Vanicream, CeraVe, Cetaphil, Eucerin, Sarna lotion or plain Vaseline Jelly. For hands apply Neutrogena [place] Hand Cream or Excipial Hand Cream.  7. Reapply moisturizer any time you start to itch or feel dry.  8. Sometimes using free and clear laundry detergents can be helpful. Fabric softener sheets should be avoided. Downy Free & Gentle liquid, or any liquid fabric softener that is free of dyes and perfumes, it acceptable to use  9. If your doctor has given you prescription creams you may apply moisturizers over them     Recommend taking Heliocare sun protection supplement daily in sunny weather for additional sun protection. For maximum protection on the sunniest days, you can take up to 2 capsules of regular Heliocare OR take 1 capsule of Heliocare Ultra. For prolonged exposure (such as a full day in the sun), you can repeat your dose of the supplement 4 hours after your first dose. Heliocare can be purchased at [employer], at some [facility] or at [email or web address].    Due to recent changes in healthcare laws, you may see results of your pathology and/or laboratory studies on MyChart before the doctors have had a chance to review them. We understand that in some cases there may be results that are confusing or concerning to you. Please understand that not all results are received at the same time and often the doctors may need to interpret  multiple results in order to provide you with the best plan of care or course of treatment. Therefore, we ask that you please give [place] 2 business days to thoroughly review all your results before contacting the office for clarification. Should we see a critical lab result, you will be contacted sooner.   If You Need Anything After Your Visit  If you have any questions or concerns for your doctor, please call our main line at [phone] and press option 4 to reach your doctor's medical assistant. If no one answers, please leave a voicemail as directed and we will return your call as soon as possible. Messages left after 4 pm will be answered the following business day.   You may also send [place] a message via [facility]. We typically respond to MyChart messages within 1-2 business days.  For prescription refills, please ask your pharmacy to contact our office. Our fax number is [phone].  If you have an urgent issue when the clinic is closed that cannot wait until the next business day, you can page your doctor at the number below.    Please note that while we do our best to be available for urgent issues outside of office hours, we are not available 24/7.   If you have an urgent issue and are unable to reach [place], you may choose to seek medical care at your doctor's office, retail clinic, urgent care center, or emergency room.  If you have  a medical emergency, please immediately call 911 or go to the emergency department.  Pager Numbers  - Dr. Nehemiah Massed: [phone]  - Dr. Laurence Ferrari: [phone]  - Dr. Nicole Kindred: [phone]  In the event of inclement weather, please call our main line at [phone] for an update on the status of any delays or closures.  Dermatology Medication Tips: Please keep the boxes that topical medications come in in order to help keep track of the instructions about where and how to use these. Pharmacies typically print the medication instructions only on the boxes and  not directly on the medication tubes.   If your medication is too expensive, please contact our office at [phone] option 4 or send [place] a message through [place].   We are unable to tell what your co-pay for medications will be in advance as this is different depending on your insurance coverage. However, we may be able to find a substitute medication at lower cost or fill out paperwork to get insurance to cover a needed medication.   If a prior authorization is required to get your medication covered by your insurance company, please allow [place] 1-2 business days to complete this process.  Drug prices often vary depending on where the prescription is filled and some pharmacies may offer cheaper prices.  The website www.goodrx.com contains coupons for medications through different pharmacies. The prices here do not account for what the cost may be with help from insurance (it may be cheaper with your insurance), but the website can give you the price if you did not use any insurance.  - You can print the associated coupon and take it with your prescription to the pharmacy.  - You may also stop by our office during regular business hours and pick up a GoodRx coupon card.  - If you need your prescription sent electronically to a different pharmacy, notify our office through [facility] or by phone at [phone] option 4.     Si Usted Necesita Algo Despus de Su Visita  Tambin puede enviarnos un mensaje a travs de Pharmacist, community. Por lo general respondemos a los mensajes de MyChart en el transcurso de 1 a 2 das hbiles.  Para renovar recetas, por favor pida a su farmacia que se ponga en contacto con nuestra oficina. Harland Dingwall de fax es [place] [phone].  Si tiene un asunto urgente cuando la clnica est cerrada y que no puede esperar hasta el siguiente da hbil, puede llamar/localizar a su doctor(a) al nmero que aparece a continuacin.   Por favor, tenga en cuenta que aunque  hacemos todo lo posible para estar disponibles para asuntos urgentes fuera del horario de [place], no estamos disponibles las 24 horas del da, los 7 das de la [place].   Si tiene un problema urgente y no puede comunicarse con nosotros, puede optar por buscar atencin mdica  en el consultorio de su doctor(a), en una clnica privada, en un centro de atencin urgente o en una sala de emergencias.  Si tiene Engineering geologist, por favor llame inmediatamente al 911 o vaya a la sala de emergencias.  Nmeros de bper  - Dr. Nehemiah Massed: [phone]  - Dra. Moye: [phone]  - Dra. Nicole Kindred: [phone]  En caso de inclemencias del [place], por favor llame a Johnsie Kindred principal al [phone] para una actualizacin sobre el [place] de cualquier retraso o cierre.  Consejos para la medicacin en dermatologa: Por favor, guarde las cajas en las que vienen los medicamentos de uso tpico para  ayudarle a seguir las [employer] dnde y cmo usarlos. Las farmacias generalmente imprimen las instrucciones del medicamento slo en las cajas y no directamente en los tubos del [place].   Si su medicamento es muy caro, por favor, pngase en contacto con Zigmund Daniel llamando al [phone] y presione la opcin 4 o envenos un mensaje a travs de Pharmacist, community.   No podemos decirle cul ser su copago por los medicamentos por adelantado ya que esto es diferente dependiendo de la cobertura de su seguro. Sin embargo, es posible que podamos encontrar un medicamento sustituto a Electrical engineer un formulario para que el seguro cubra el medicamento que se considera necesario.   Si se requiere una autorizacin previa para que su compaa de seguros [place] su medicamento, por favor permtanos de 1 a 2 das hbiles para completar este proceso.  Los precios de los medicamentos varan con frecuencia dependiendo del Environmental consultant de dnde se surte la receta y alguna farmacias pueden ofrecer precios ms  baratos.  El sitio web www.goodrx.com tiene cupones para medicamentos de Airline pilot. Los precios aqu no tienen en cuenta lo que podra costar con la ayuda del seguro (puede ser ms barato con su seguro), pero el sitio web puede darle el precio si no utiliz Research scientist (physical sciences).  - Puede imprimir el cupn correspondiente y llevarlo con su receta a la farmacia.  - Tambin puede pasar por nuestra oficina durante el horario de atencin regular y Charity fundraiser una tarjeta de cupones de GoodRx.  - Si necesita que su receta se enve electrnicamente a una farmacia diferente, informe a nuestra oficina a travs de MyChart de  o por telfono llamando al [phone] y presione la opcin 4.

## 2022-06-18 ENCOUNTER — Encounter: Payer: Self-pay | Admitting: Dermatology

## 2022-06-20 ENCOUNTER — Other Ambulatory Visit: Payer: Self-pay | Admitting: Neurology

## 2022-06-20 DIAGNOSIS — G44321 Chronic post-traumatic headache, intractable: Secondary | ICD-10-CM

## 2022-06-27 ENCOUNTER — Ambulatory Visit
Admission: RE | Admit: 2022-06-27 | Discharge: 2022-06-27 | Disposition: A | Discharge status: Home / Self Care | Payer: 59 | Source: Ambulatory Visit | Attending: Neurology | Admitting: Neurology

## 2022-06-27 DIAGNOSIS — G44321 Chronic post-traumatic headache, intractable: Secondary | ICD-10-CM

## 2022-06-27 MED ORDER — GADOBENATE DIMEGLUMINE 529 MG/ML IV SOLN
15.0000 mL | Freq: Once | INTRAVENOUS | Status: AC | PRN
  Administered 2022-06-27: 15 mL via INTRAVENOUS

## 2022-08-01 ENCOUNTER — Ambulatory Visit: Payer: 59 | Admitting: Dermatology

## 2022-09-08 ENCOUNTER — Ambulatory Visit: Payer: 59 | Admitting: Dermatology

## 2022-09-12 ENCOUNTER — Ambulatory Visit: Payer: 59 | Attending: Neurology | Admitting: Speech Pathology

## 2022-09-12 ENCOUNTER — Encounter: Payer: Self-pay | Admitting: Speech Pathology

## 2022-09-12 DIAGNOSIS — R41841 Cognitive communication deficit: Secondary | ICD-10-CM | POA: Insufficient documentation
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
## 2022-09-15 ENCOUNTER — Ambulatory Visit: Payer: 59 | Admitting: Speech Pathology

## 2022-09-20 ENCOUNTER — Ambulatory Visit: Payer: 59 | Admitting: Speech Pathology

## 2022-09-22 ENCOUNTER — Ambulatory Visit: Payer: 59 | Admitting: Speech Pathology

## 2022-09-22 DIAGNOSIS — R41841 Cognitive communication deficit: Secondary | ICD-10-CM
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
## 2022-09-27 ENCOUNTER — Ambulatory Visit: Payer: 59 | Admitting: Speech Pathology
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
## 2022-09-29 ENCOUNTER — Ambulatory Visit: Payer: 59

## 2022-09-29 ENCOUNTER — Ambulatory Visit: Payer: 59 | Admitting: Speech Pathology

## 2022-10-03 ENCOUNTER — Ambulatory Visit: Payer: 59 | Attending: Neurology | Admitting: Speech Pathology

## 2022-10-03 ENCOUNTER — Ambulatory Visit: Payer: 59

## 2022-10-03 DIAGNOSIS — M542 Cervicalgia: Secondary | ICD-10-CM

## 2022-10-03 DIAGNOSIS — R42 Dizziness and giddiness: Secondary | ICD-10-CM

## 2022-10-03 DIAGNOSIS — R2681 Unsteadiness on feet: Secondary | ICD-10-CM | POA: Insufficient documentation

## 2022-10-03 DIAGNOSIS — M6281 Muscle weakness (generalized): Secondary | ICD-10-CM | POA: Diagnosis present

## 2022-10-03 DIAGNOSIS — R41841 Cognitive communication deficit: Secondary | ICD-10-CM | POA: Diagnosis present
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
## 2022-10-04 ENCOUNTER — Encounter: Payer: 59 | Admitting: Speech Pathology

## 2022-10-06 ENCOUNTER — Encounter: Payer: 59 | Admitting: Speech Pathology

## 2022-10-11 ENCOUNTER — Ambulatory Visit: Payer: 59 | Admitting: Speech Pathology

## 2022-10-13 ENCOUNTER — Encounter: Payer: 59 | Admitting: Speech Pathology

## 2022-10-17 ENCOUNTER — Ambulatory Visit: Payer: 59 | Admitting: Speech Pathology

## 2022-10-17 DIAGNOSIS — R41841 Cognitive communication deficit: Secondary | ICD-10-CM | POA: Diagnosis not present
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
## 2022-10-18 ENCOUNTER — Encounter: Payer: 59 | Admitting: Speech Pathology

## 2022-10-20 ENCOUNTER — Encounter: Payer: 59 | Admitting: Speech Pathology

## 2022-10-24 ENCOUNTER — Ambulatory Visit: Payer: 59

## 2022-10-24 ENCOUNTER — Ambulatory Visit: Payer: 59 | Admitting: Speech Pathology

## 2022-10-24 DIAGNOSIS — M6281 Muscle weakness (generalized): Secondary | ICD-10-CM

## 2022-10-24 DIAGNOSIS — R41841 Cognitive communication deficit: Secondary | ICD-10-CM | POA: Diagnosis not present

## 2022-10-24 DIAGNOSIS — M542 Cervicalgia: Secondary | ICD-10-CM

## 2022-10-24 DIAGNOSIS — R2681 Unsteadiness on feet: Secondary | ICD-10-CM
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
## 2022-10-25 ENCOUNTER — Encounter: Payer: 59 | Admitting: Speech Pathology

## 2022-10-27 ENCOUNTER — Encounter: Payer: 59 | Admitting: Speech Pathology

## 2022-10-27 ENCOUNTER — Ambulatory Visit: Payer: 59 | Admitting: Dermatology

## 2022-10-27 VITALS — BP 114/68

## 2022-10-27 DIAGNOSIS — B351 Tinea unguium: Secondary | ICD-10-CM | POA: Diagnosis not present

## 2022-10-27 DIAGNOSIS — R21 Rash and other nonspecific skin eruption: Secondary | ICD-10-CM | POA: Diagnosis not present

## 2022-10-27 DIAGNOSIS — L219 Seborrheic dermatitis, unspecified: Secondary | ICD-10-CM

## 2022-10-27 DIAGNOSIS — Z79899 Other long term (current) drug therapy: Secondary | ICD-10-CM

## 2022-10-27 DIAGNOSIS — L603 Nail dystrophy: Secondary | ICD-10-CM | POA: Diagnosis not present

## 2022-10-27 MED ORDER — ZORYVE 0.3 % EX CREA: 1.0000 | TOPICAL_CREAM | Freq: Every day | CUTANEOUS | 3 refills | Status: DC

## 2022-10-27 NOTE — Patient Instructions (Addendum)
Your prescription was sent to [facility] in [place]. A representative from [facility] will contact you within 3 business hours to verify your address and insurance information to schedule a free delivery. If for any reason you do not receive a phone call from them, please reach out to them. Their phone number is [phone] and their hours are Monday-Friday 9:00 am-5:00 pm.    Continue Ketoconazole 2% shampoo at least 3 days per week. May alternate with a salicylic acid and/or tar shampoos  Due to recent changes in healthcare laws, you may see results of your pathology and/or laboratory studies on MyChart before the doctors have had a chance to review them. We understand that in some cases there may be results that are confusing or concerning to you. Please understand that not all results are received at the same time and often the doctors may need to interpret multiple results in order to provide you with the best plan of care or course of treatment. Therefore, we ask that you please give [place] 2 business days to thoroughly review all your results before contacting the office for clarification. Should we see a critical lab result, you will be contacted sooner.   If You Need Anything After Your Visit  If you have any questions or concerns for your doctor, please call our main line at [phone] and press option 4 to reach your doctor's medical assistant. If no one answers, please leave a voicemail as directed and we will return your call as soon as possible. Messages left after 4 pm will be answered the following business day.   You may also send [place] a message via [place]. We typically respond to MyChart messages within 1-2 business days.  For prescription refills, please ask your pharmacy to contact our office. Our fax number is [phone].  If you have an urgent issue when the clinic is closed that cannot wait until the next business day, you can page your doctor at the number below.     Please note that while we do our best to be available for urgent issues outside of office hours, we are not available 24/7.   If you have an urgent issue and are unable to reach [place], you may choose to seek medical care at your doctor's office, retail clinic, urgent care center, or emergency room.  If you have a medical emergency, please immediately call 911 or go to the emergency department.  Pager Numbers  - Dr. Nehemiah Massed: [phone]  - Dr. Laurence Ferrari: [phone]  - Dr. Nicole Kindred: [phone]  In the event of inclement weather, please call our main line at [phone] for an update on the status of any delays or closures.  Dermatology Medication Tips: Please keep the boxes that topical medications come in in order to help keep track of the instructions about where and how to use these. Pharmacies typically print the medication instructions only on the boxes and not directly on the medication tubes.   If your medication is too expensive, please contact our office at [phone] option 4 or send [place] a message through [place].   We are unable to tell what your co-pay for medications will be in advance as this is different depending on your insurance coverage. However, we may be able to find a substitute medication at lower cost or fill out paperwork to get insurance to cover a needed medication.   If a prior authorization is required to get your medication covered by your insurance company, please allow [place]  1-2 business days to complete this process.  Drug prices often vary depending on where the prescription is filled and some pharmacies may offer cheaper prices.  The website www.goodrx.com contains coupons for medications through different pharmacies. The prices here do not account for what the cost may be with help from insurance (it may be cheaper with your insurance), but the website can give you the price if you did not use any insurance.  - You can print the associated coupon and take  it with your prescription to the pharmacy.  - You may also stop by our office during regular business hours and pick up a GoodRx coupon card.  - If you need your prescription sent electronically to a different pharmacy, notify our office through [facility] or by phone at [phone] option 4.     Si Usted Necesita Algo Despus de Su Visita  Tambin puede enviarnos un mensaje a travs de Pharmacist, community. Por lo general respondemos a los mensajes de MyChart en el transcurso de 1 a 2 das hbiles.  Para renovar recetas, por favor pida a su farmacia que se ponga en contacto con nuestra oficina. Harland Dingwall de fax es [place] [phone].  Si tiene un asunto urgente cuando la clnica est cerrada y que no puede esperar hasta el siguiente da hbil, puede llamar/localizar a su doctor(a) al nmero que aparece a continuacin.   Por favor, tenga en cuenta que aunque hacemos todo lo posible para estar disponibles para asuntos urgentes fuera del horario de [place], no estamos disponibles las 24 horas del da, los 7 das de la [place].   Si tiene un problema urgente y no puede comunicarse con nosotros, puede optar por buscar atencin mdica  en el consultorio de su doctor(a), en una clnica privada, en un centro de atencin urgente o en una sala de emergencias.  Si tiene Engineering geologist, por favor llame inmediatamente al 911 o vaya a la sala de emergencias.  Nmeros de bper  - Dr. Nehemiah Massed: [phone]  - Dra. Moye: [phone]  - Dra. Nicole Kindred: [phone]  En caso de inclemencias del [place], por favor llame a Johnsie Kindred principal al [phone] para una actualizacin sobre el [place] de cualquier retraso o cierre.  Consejos para la medicacin en dermatologa: Por favor, guarde las cajas en las que vienen los medicamentos de uso tpico para ayudarle a seguir las instrucciones sobre dnde y cmo usarlos. Las farmacias generalmente imprimen las instrucciones del medicamento slo en las  cajas y no directamente en los tubos del [place].   Si su medicamento es muy caro, por favor, pngase en contacto con Zigmund Daniel llamando al [phone] y presione la opcin 4 o envenos un mensaje a travs de Pharmacist, community.   No podemos decirle cul ser su copago por los medicamentos por adelantado ya que esto es diferente dependiendo de la cobertura de su seguro. Sin embargo, es posible que podamos encontrar un medicamento sustituto a Electrical engineer un formulario para que el seguro cubra el medicamento que se considera necesario.   Si se requiere una autorizacin previa para que su compaa de seguros [place] su medicamento, por favor permtanos de 1 a 2 das hbiles para completar este proceso.  Los precios de los medicamentos varan con frecuencia dependiendo del Environmental consultant de dnde se surte la receta y alguna farmacias pueden ofrecer precios ms baratos.  El sitio web www.goodrx.com tiene cupones para medicamentos de Airline pilot. Los precios aqu no tienen en cuenta lo que podra costar  lo que podra costar con la ayuda del seguro (puede ser ms barato con su seguro), pero el sitio web puede darle el precio si no utiliz ningn seguro.  - Puede imprimir el cupn correspondiente y llevarlo con su receta a la farmacia.  - Tambin puede pasar por nuestra oficina durante el horario de atencin regular y recoger una tarjeta de cupones de GoodRx.  - Si necesita que su receta se enve electrnicamente a una farmacia diferente, informe a nuestra oficina a travs de MyChart de [place] o por telfono llamando al [phone] y presione la opcin 4.  

## 2022-10-27 NOTE — Progress Notes (Signed)
   Follow-Up Visit   Subjective  Desiree Lee is a 47 y.o. female who presents for the following: Other (Patches of hands - she has been using clobetasol and they have improved. Recheck seb derm of scalp - she is treating with ketoconazole 2% shampoo but still has an odor).  The following portions of the chart were reviewed this encounter and updated as appropriate:   Tobacco  Allergies  Meds  Problems  Med Hx  Surg Hx  Fam Hx     Review of Systems:  No other skin or systemic complaints except as noted in HPI or Assessment and Plan.  Objective  Well appearing patient in no apparent distress; mood and affect are within normal limits.  A focused examination was performed including scalp, arms/hands, feet. Relevant physical exam findings are noted in the Assessment and Plan.  Slight thickening on skin of right elbow. Fissures, scale and thickening of skin of knuckles   Nail dystrophy      Assessment & Plan  Rash Psoriasis with atopic overlap  Advised patient she may see skin improvements once she starts Enbrel. Continue : Clobetasol qd until improved.  Start Zoryve cream qd  Roflumilast (ZORYVE) 0.3 % CREA Apply 1 Application topically daily.  Toenail Changes Tinea unguium vs psoriatic nail changes Clippings of nails sent to [facility] for nail molecular panel  Seborrheic dermatitis / SeboPsoriasis Scalp Continue Ketoconazole 2% shampoo at least 3 days per week.  Recommend alternate with a salicylic acid and/or tar shampoos  Related Medications ketoconazole (NIZORAL) 2 % shampoo Shampoo into scalp let sit 5-10 minutes then wash out. Use 3 days per week.  Return in about 4 months (around 02/25/2023) for Follow up.  I, Ashok Cordia, CMA, am acting as scribe for Sarina Ser, MD . Documentation: I have reviewed the above documentation for accuracy and completeness, and I agree with the above.  Sarina Ser, MD

## 2022-10-31 ENCOUNTER — Telehealth: Payer: Self-pay

## 2022-10-31 ENCOUNTER — Ambulatory Visit: Payer: 59

## 2022-10-31 ENCOUNTER — Ambulatory Visit: Payer: 59 | Admitting: Speech Pathology

## 2022-10-31 ENCOUNTER — Encounter: Payer: Self-pay | Admitting: Dermatology

## 2022-10-31 NOTE — Telephone Encounter (Signed)
[facility] report scanned in under the media tab for your review.

## 2022-11-01 ENCOUNTER — Encounter: Payer: 59 | Admitting: Speech Pathology

## 2022-11-01 NOTE — Telephone Encounter (Signed)
Patient was called with no answer. Left a VM.

## 2022-11-02 ENCOUNTER — Ambulatory Visit: Payer: 59

## 2022-11-02 ENCOUNTER — Encounter: Payer: 59 | Admitting: Speech Pathology

## 2022-11-03 ENCOUNTER — Encounter: Payer: 59 | Admitting: Speech Pathology

## 2022-11-08 ENCOUNTER — Ambulatory Visit: Payer: 59 | Admitting: Speech Pathology

## 2022-11-08 ENCOUNTER — Ambulatory Visit: Payer: 59

## 2022-11-10 ENCOUNTER — Ambulatory Visit: Payer: 59 | Attending: Neurology

## 2022-11-10 ENCOUNTER — Encounter: Payer: 59 | Admitting: Speech Pathology

## 2022-11-10 DIAGNOSIS — R2681 Unsteadiness on feet: Secondary | ICD-10-CM | POA: Diagnosis present

## 2022-11-10 DIAGNOSIS — R42 Dizziness and giddiness: Secondary | ICD-10-CM | POA: Diagnosis present
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
## 2022-11-14 ENCOUNTER — Encounter: Payer: 59 | Admitting: Speech Pathology

## 2022-11-15 ENCOUNTER — Ambulatory Visit: Payer: 59

## 2022-11-15 ENCOUNTER — Encounter: Payer: 59 | Admitting: Speech Pathology

## 2022-11-15 ENCOUNTER — Ambulatory Visit: Payer: 59 | Admitting: Speech Pathology

## 2022-11-16 ENCOUNTER — Encounter: Payer: 59 | Admitting: Speech Pathology

## 2022-11-17 ENCOUNTER — Ambulatory Visit: Payer: 59

## 2022-11-17 ENCOUNTER — Encounter: Payer: 59 | Admitting: Speech Pathology

## 2022-11-22 ENCOUNTER — Ambulatory Visit: Payer: 59 | Admitting: Speech Pathology

## 2022-11-22 ENCOUNTER — Ambulatory Visit: Payer: 59
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
## 2022-11-24 ENCOUNTER — Encounter: Payer: 59 | Admitting: Speech Pathology

## 2022-11-24 ENCOUNTER — Ambulatory Visit: Payer: 59

## 2022-11-29 ENCOUNTER — Ambulatory Visit: Payer: 59

## 2022-11-29 ENCOUNTER — Encounter: Payer: 59 | Admitting: Speech Pathology

## 2022-12-01 ENCOUNTER — Ambulatory Visit: Payer: 59

## 2022-12-01 ENCOUNTER — Encounter: Payer: 59 | Admitting: Speech Pathology

## 2022-12-05 ENCOUNTER — Ambulatory Visit: Payer: 59

## 2022-12-05 ENCOUNTER — Encounter: Payer: 59 | Admitting: Speech Pathology

## 2022-12-06 ENCOUNTER — Encounter: Payer: 59 | Admitting: Speech Pathology

## 2022-12-08 ENCOUNTER — Ambulatory Visit: Payer: 59

## 2022-12-08 ENCOUNTER — Ambulatory Visit: Payer: 59 | Admitting: Speech Pathology

## 2022-12-08 ENCOUNTER — Encounter: Payer: 59 | Admitting: Speech Pathology

## 2022-12-12 ENCOUNTER — Encounter: Payer: 59 | Admitting: Speech Pathology

## 2022-12-12 ENCOUNTER — Ambulatory Visit: Payer: 59

## 2022-12-14 ENCOUNTER — Encounter: Payer: 59 | Admitting: Speech Pathology

## 2022-12-14 ENCOUNTER — Ambulatory Visit: Payer: 59

## 2022-12-19 ENCOUNTER — Ambulatory Visit: Payer: 59

## 2022-12-19 ENCOUNTER — Encounter: Payer: 59 | Admitting: Speech Pathology

## 2022-12-21 ENCOUNTER — Ambulatory Visit: Payer: 59

## 2022-12-21 ENCOUNTER — Encounter: Payer: 59 | Admitting: Speech Pathology

## 2022-12-26 ENCOUNTER — Ambulatory Visit: Payer: 59

## 2022-12-26 ENCOUNTER — Encounter: Payer: 59 | Admitting: Speech Pathology

## 2022-12-28 ENCOUNTER — Encounter: Payer: 59 | Admitting: Speech Pathology

## 2022-12-28 ENCOUNTER — Ambulatory Visit: Payer: 59

## 2023-01-02 ENCOUNTER — Ambulatory Visit: Payer: 59

## 2023-01-02 ENCOUNTER — Encounter: Payer: 59 | Admitting: Speech Pathology

## 2023-01-04 ENCOUNTER — Ambulatory Visit: Payer: 59

## 2023-01-04 ENCOUNTER — Encounter: Payer: 59 | Admitting: Speech Pathology

## 2023-01-09 ENCOUNTER — Encounter: Payer: 59 | Admitting: Speech Pathology

## 2023-01-09 ENCOUNTER — Ambulatory Visit: Payer: 59

## 2023-01-11 ENCOUNTER — Encounter: Payer: 59 | Admitting: Speech Pathology

## 2023-01-11 ENCOUNTER — Ambulatory Visit: Payer: 59

## 2023-01-16 ENCOUNTER — Encounter: Payer: 59 | Admitting: Speech Pathology

## 2023-01-16 ENCOUNTER — Ambulatory Visit: Payer: 59

## 2023-01-18 ENCOUNTER — Encounter: Payer: 59 | Admitting: Speech Pathology

## 2023-01-18 ENCOUNTER — Ambulatory Visit: Payer: 59

## 2023-01-23 ENCOUNTER — Encounter: Payer: 59 | Admitting: Speech Pathology

## 2023-01-23 ENCOUNTER — Ambulatory Visit: Payer: 59

## 2023-01-25 ENCOUNTER — Ambulatory Visit: Payer: 59

## 2023-01-25 ENCOUNTER — Encounter: Payer: 59 | Admitting: Speech Pathology

## 2023-01-30 ENCOUNTER — Ambulatory Visit: Payer: 59

## 2023-01-30 ENCOUNTER — Encounter: Payer: 59 | Admitting: Speech Pathology

## 2023-02-01 ENCOUNTER — Encounter: Payer: 59 | Admitting: Speech Pathology

## 2023-02-01 ENCOUNTER — Ambulatory Visit: Payer: 59

## 2023-02-06 ENCOUNTER — Encounter: Payer: 59 | Admitting: Speech Pathology

## 2023-02-06 ENCOUNTER — Ambulatory Visit: Payer: 59

## 2023-02-08 ENCOUNTER — Encounter: Payer: 59 | Admitting: Speech Pathology

## 2023-02-08 ENCOUNTER — Ambulatory Visit: Payer: 59

## 2023-02-13 ENCOUNTER — Encounter: Payer: 59 | Admitting: Speech Pathology

## 2023-02-13 ENCOUNTER — Ambulatory Visit: Payer: 59

## 2023-02-15 ENCOUNTER — Encounter: Payer: 59 | Admitting: Speech Pathology

## 2023-02-15 ENCOUNTER — Ambulatory Visit: Payer: 59

## 2023-02-22 ENCOUNTER — Encounter: Payer: 59 | Admitting: Speech Pathology

## 2023-02-22 ENCOUNTER — Ambulatory Visit: Payer: 59

## 2023-02-23 ENCOUNTER — Ambulatory Visit: Payer: BC Managed Care – PPO | Admitting: Dermatology

## 2023-02-23 VITALS — BP 100/69 | HR 95

## 2023-02-23 DIAGNOSIS — L609 Nail disorder, unspecified: Secondary | ICD-10-CM

## 2023-02-23 DIAGNOSIS — L409 Psoriasis, unspecified: Secondary | ICD-10-CM

## 2023-02-23 DIAGNOSIS — L209 Atopic dermatitis, unspecified: Secondary | ICD-10-CM | POA: Diagnosis not present

## 2023-02-23 DIAGNOSIS — W908XXA Exposure to other nonionizing radiation, initial encounter: Secondary | ICD-10-CM

## 2023-02-23 DIAGNOSIS — L4050 Arthropathic psoriasis, unspecified: Secondary | ICD-10-CM | POA: Diagnosis not present

## 2023-02-23 DIAGNOSIS — L219 Seborrheic dermatitis, unspecified: Secondary | ICD-10-CM

## 2023-02-23 DIAGNOSIS — Z79899 Other long term (current) drug therapy: Secondary | ICD-10-CM

## 2023-02-23 DIAGNOSIS — Z7189 Other specified counseling: Secondary | ICD-10-CM

## 2023-02-23 DIAGNOSIS — L2089 Other atopic dermatitis: Secondary | ICD-10-CM

## 2023-02-23 DIAGNOSIS — Z872 Personal history of diseases of the skin and subcutaneous tissue: Secondary | ICD-10-CM

## 2023-02-23 DIAGNOSIS — L578 Other skin changes due to chronic exposure to nonionizing radiation: Secondary | ICD-10-CM

## 2023-02-23 DIAGNOSIS — X32XXXA Exposure to sunlight, initial encounter: Secondary | ICD-10-CM

## 2023-02-23 NOTE — Progress Notes (Signed)
Follow-Up Visit   Subjective  Desiree Lee is a 47 y.o. female who presents for the following: Psoriasis with atopic dermatitis and PsA - Pt was on Enbrel for two months, but had to go off of medication due to insurance change. She starts Humira injections tomorrow. Currently the rash has been better controlled, and she uses Clobetasol foam and Zoryve cream PRN. Seb derm has also been well controlled with Ketoconazole 2% shampoo a few days per week.    The following portions of the chart were reviewed this encounter and updated as appropriate: medications, allergies, medical history  Review of Systems:  No other skin or systemic complaints except as noted in HPI or Assessment and Plan.  Objective  Well appearing patient in no apparent distress; mood and affect are within normal limits.  Areas Examined: The face and extremities  Relevant exam findings are noted in the Assessment and Plan.      Assessment & Plan     PSORIASIS with PsA and atopic dermatitis over lap, pt also has nail involvement Well-demarcated erythematous papules/plaques with silvery scale, guttate pink scaly papules.   Chronic and persistent condition with duration or expected duration over one year. Condition is symptomatic/ bothersome to patient. Not currently at goal.  Treatment Plan: Start Humira as prescribed by Yvone Neu PA-C. Continue Zoryve cream QD PRN, Clobetasol foam QD PRN up to 5d/wk, and Opzelura cream to aa's QD PRN.   Counseling on psoriasis and coordination of care  psoriasis is a chronic non-curable, but treatable genetic/hereditary disease that may have other systemic features affecting other organ systems such as joints (Psoriatic Arthritis). It is associated with an increased risk of inflammatory bowel disease, heart disease, non-alcoholic fatty liver disease, and depression.  Treatments include light and laser treatments; topical medications; and systemic medications including oral  and injectables.  SEBORRHEIC DERMATITIS/sebopsoriasis  Exam: Pink patches with greasy scale at scalp  Chronic condition with duration or expected duration over one year. Currently well-controlled.  Seborrheic Dermatitis is a chronic persistent rash characterized by pinkness and scaling most commonly of the mid face but also can occur on the scalp (dandruff), ears; mid chest, mid back and groin.  It tends to be exacerbated by stress and cooler weather.  People who have neurologic disease may experience new onset or exacerbation of existing seborrheic dermatitis.  The condition is not curable but treatable and can be controlled.  Treatment Plan: Continue Ketoconazole 2% shampoo let sit 5-10 minutes then wash out.   HISTORY OF PRECANCEROUS ACTINIC KERATOSIS - mid frontal hairline  - site(s) of PreCancerous Actinic Keratosis clear today. - these may recur and new lesions may form requiring treatment to prevent transformation into skin cancer - observe for new or changing spots and contact [facility] for appointment if occur - photoprotection with sun protective clothing; sunglasses and broad spectrum sunscreen with SPF of at least 30 + and frequent self skin exams recommended - yearly exams by a dermatologist recommended for persons with history of PreCancerous Actinic Keratoses  ACTINIC DAMAGE - chronic, secondary to cumulative UV radiation exposure/sun exposure over time - diffuse scaly erythematous macules with underlying dyspigmentation - Recommend daily broad spectrum sunscreen SPF 30+ to sun-exposed areas, reapply every 2 hours as needed.  - Recommend staying in the shade or wearing long sleeves, sun glasses (UVA+UVB protection) and wide brim hats (4-inch brim around the entire circumference of the hat). - Call for new or changing lesions.  NAIL PROBLEM Exam: Ridges in  the nail.   Treatment Plan: Benign-appearing.  Observation. Recommend protective nail polish like Nuvail or  Dermanail.  Return in about 6 months (around 08/26/2023) for psoriasis follow up and TBSE.  Maylene Roes, CMA, am acting as scribe for Armida Sans, MD .  Documentation: I have reviewed the above documentation for accuracy and completeness, and I agree with the above.  Armida Sans, MD

## 2023-02-23 NOTE — Patient Instructions (Addendum)
Due to recent changes in healthcare laws, you may see results of your pathology and/or laboratory studies on MyChart before the doctors have had a chance to review them. We understand that in some cases there may be results that are confusing or concerning to you. Please understand that not all results are received at the same time and often the doctors may need to interpret multiple results in order to provide you with the best plan of care or course of treatment. Therefore, we ask that you please give us 2 business days to thoroughly review all your results before contacting the office for clarification. Should we see a critical lab result, you will be contacted sooner.   If You Need Anything After Your Visit  If you have any questions or concerns for your doctor, please call our main line at [phone] and press option 4 to reach your doctor's medical assistant. If no one answers, please leave a voicemail as directed and we will return your call as soon as possible. Messages left after 4 pm will be answered the following business day.   You may also send us a message via MyChart. We typically respond to MyChart messages within 1-2 business days.  For prescription refills, please ask your pharmacy to contact our office. Our fax number is [phone].  If you have an urgent issue when the clinic is closed that cannot wait until the next business day, you can page your doctor at the number below.    Please note that while we do our best to be available for urgent issues outside of office hours, we are not available 24/7.   If you have an urgent issue and are unable to reach us, you may choose to seek medical care at your doctor's office, retail clinic, urgent care center, or emergency room.  If you have a medical emergency, please immediately call 911 or go to the emergency department.  Pager Numbers  - Dr. Kowalski: [phone]  - Dr. Moye: [phone]  - Dr. Stewart:  [phone]  In the event of inclement weather, please call our main line at [phone] for an update on the status of any delays or closures.  Dermatology Medication Tips: Please keep the boxes that topical medications come in in order to help keep track of the instructions about where and how to use these. Pharmacies typically print the medication instructions only on the boxes and not directly on the medication tubes.   If your medication is too expensive, please contact our office at [phone] option 4 or send us a message through MyChart.   We are unable to tell what your co-pay for medications will be in advance as this is different depending on your insurance coverage. However, we may be able to find a substitute medication at lower cost or fill out paperwork to get insurance to cover a needed medication.   If a prior authorization is required to get your medication covered by your insurance company, please allow us 1-2 business days to complete this process.  Drug prices often vary depending on where the prescription is filled and some pharmacies may offer cheaper prices.  The website www.goodrx.com contains coupons for medications through different pharmacies. The prices here do not account for what the cost may be with help from insurance (it may be cheaper with your insurance), but the website can give you the price if you did not use any insurance.  - You can print the associated coupon and take it with   your prescription to the pharmacy.  - You may also stop by our office during regular business hours and pick up a GoodRx coupon card.  - If you need your prescription sent electronically to a different pharmacy, notify our office through [facility] MyChart or by phone at [phone] option 4.     Si Usted Necesita Algo Despus de Su Visita  Tambin puede enviarnos un mensaje a travs de MyChart. Por lo general respondemos a los mensajes de MyChart en el transcurso de 1 a 2  das hbiles.  Para renovar recetas, por favor pida a su farmacia que se ponga en contacto con nuestra oficina. Nuestro nmero de fax es el [phone].  Si tiene un asunto urgente cuando la clnica est cerrada y que no puede esperar hasta el siguiente da hbil, puede llamar/localizar a su doctor(a) al nmero que aparece a continuacin.   Por favor, tenga en cuenta que aunque hacemos todo lo posible para estar disponibles para asuntos urgentes fuera del horario de oficina, no estamos disponibles las 24 horas del da, los 7 das de la semana.   Si tiene un problema urgente y no puede comunicarse con nosotros, puede optar por buscar atencin mdica  en el consultorio de su doctor(a), en una clnica privada, en un centro de atencin urgente o en una sala de emergencias.  Si tiene una emergencia mdica, por favor llame inmediatamente al 911 o vaya a la sala de emergencias.  Nmeros de bper  - Dr. Kowalski: [phone]  - Dra. Moye: [phone]  - Dra. Stewart: [phone]  En caso de inclemencias del tiempo, por favor llame a nuestra lnea principal al [phone] para una actualizacin sobre el estado de cualquier retraso o cierre.  Consejos para la medicacin en dermatologa: Por favor, guarde las cajas en las que vienen los medicamentos de uso tpico para ayudarle a seguir las instrucciones sobre dnde y cmo usarlos. Las farmacias generalmente imprimen las instrucciones del medicamento slo en las cajas y no directamente en los tubos del medicamento.   Si su medicamento es muy caro, por favor, pngase en contacto con nuestra oficina llamando al [phone] y presione la opcin 4 o envenos un mensaje a travs de MyChart.   No podemos decirle cul ser su copago por los medicamentos por adelantado ya que esto es diferente dependiendo de la cobertura de su seguro. Sin embargo, es posible que podamos encontrar un medicamento sustituto a menor costo o llenar un formulario para que el  seguro cubra el medicamento que se considera necesario.   Si se requiere una autorizacin previa para que su compaa de seguros cubra su medicamento, por favor permtanos de 1 a 2 das hbiles para completar este proceso.  Los precios de los medicamentos varan con frecuencia dependiendo del lugar de dnde se surte la receta y alguna farmacias pueden ofrecer precios ms baratos.  El sitio web www.goodrx.com tiene cupones para medicamentos de diferentes farmacias. Los precios aqu no tienen en cuenta lo que podra costar con la ayuda del seguro (puede ser ms barato con su seguro), pero el sitio web puede darle el precio si no utiliz ningn seguro.  - Puede imprimir el cupn correspondiente y llevarlo con su receta a la farmacia.  - Tambin puede pasar por nuestra oficina durante el horario de atencin regular y recoger una tarjeta de cupones de GoodRx.  - Si necesita que su receta se enve electrnicamente a una farmacia diferente, informe a nuestra oficina a travs de MyChart de [facility]   o por telfono llamando al [phone] y presione la opcin 4.  

## 2023-02-27 ENCOUNTER — Encounter: Payer: 59 | Admitting: Speech Pathology

## 2023-02-27 ENCOUNTER — Ambulatory Visit: Payer: 59

## 2023-03-01 ENCOUNTER — Ambulatory Visit: Payer: 59

## 2023-03-01 ENCOUNTER — Encounter: Payer: 59 | Admitting: Speech Pathology

## 2023-03-06 ENCOUNTER — Encounter: Payer: 59 | Admitting: Speech Pathology

## 2023-03-06 ENCOUNTER — Ambulatory Visit: Payer: 59

## 2023-03-08 ENCOUNTER — Encounter: Payer: 59 | Admitting: Speech Pathology

## 2023-03-08 ENCOUNTER — Encounter: Payer: Self-pay | Admitting: Dermatology

## 2023-03-08 ENCOUNTER — Ambulatory Visit: Payer: 59

## 2023-03-13 ENCOUNTER — Encounter: Payer: 59 | Admitting: Speech Pathology

## 2023-03-13 ENCOUNTER — Ambulatory Visit: Payer: 59

## 2023-03-15 ENCOUNTER — Encounter: Payer: 59 | Admitting: Speech Pathology

## 2023-03-15 ENCOUNTER — Ambulatory Visit: Payer: 59

## 2023-03-20 ENCOUNTER — Ambulatory Visit: Payer: 59

## 2023-03-20 ENCOUNTER — Encounter: Payer: 59 | Admitting: Speech Pathology

## 2023-03-20 ENCOUNTER — Ambulatory Visit: Payer: BC Managed Care – PPO

## 2023-03-20 DIAGNOSIS — R197 Diarrhea, unspecified: Secondary | ICD-10-CM

## 2023-03-20 DIAGNOSIS — K2950 Unspecified chronic gastritis without bleeding: Secondary | ICD-10-CM

## 2023-03-20 DIAGNOSIS — R1030 Lower abdominal pain, unspecified: Secondary | ICD-10-CM

## 2023-03-22 ENCOUNTER — Ambulatory Visit: Payer: 59

## 2023-03-22 ENCOUNTER — Encounter: Payer: 59 | Admitting: Speech Pathology

## 2023-03-27 ENCOUNTER — Ambulatory Visit: Payer: 59

## 2023-03-27 ENCOUNTER — Encounter: Payer: 59 | Admitting: Speech Pathology

## 2023-03-29 ENCOUNTER — Ambulatory Visit: Payer: 59

## 2023-03-29 ENCOUNTER — Encounter: Payer: 59 | Admitting: Speech Pathology

## 2023-04-03 ENCOUNTER — Ambulatory Visit: Payer: 59

## 2023-04-03 ENCOUNTER — Encounter: Payer: 59 | Admitting: Speech Pathology

## 2023-04-04 ENCOUNTER — Other Ambulatory Visit: Payer: Self-pay | Admitting: Family Medicine

## 2023-04-04 DIAGNOSIS — Z1231 Encounter for screening mammogram for malignant neoplasm of breast: Secondary | ICD-10-CM

## 2023-04-05 ENCOUNTER — Encounter: Payer: 59 | Admitting: Speech Pathology

## 2023-04-05 ENCOUNTER — Ambulatory Visit: Payer: 59

## 2023-04-10 ENCOUNTER — Encounter: Payer: 59 | Admitting: Speech Pathology

## 2023-04-10 ENCOUNTER — Ambulatory Visit: Payer: 59

## 2023-04-11 ENCOUNTER — Ambulatory Visit (INDEPENDENT_AMBULATORY_CARE_PROVIDER_SITE_OTHER): Payer: BC Managed Care – PPO

## 2023-04-11 ENCOUNTER — Other Ambulatory Visit (HOSPITAL_COMMUNITY)
Admission: RE | Admit: 2023-04-11 | Discharge: 2023-04-11 | Disposition: A | Discharge status: Home / Self Care | Payer: BC Managed Care – PPO | Source: Ambulatory Visit

## 2023-04-11 VITALS — BP 116/79 | HR 103 | Ht 66.0 in | Wt 154.0 lb

## 2023-04-11 DIAGNOSIS — Z01419 Encounter for gynecological examination (general) (routine) without abnormal findings: Secondary | ICD-10-CM | POA: Diagnosis present

## 2023-04-11 DIAGNOSIS — N941 Unspecified dyspareunia: Secondary | ICD-10-CM

## 2023-04-11 DIAGNOSIS — R87610 Atypical squamous cells of undetermined significance on cytologic smear of cervix (ASC-US): Secondary | ICD-10-CM

## 2023-04-11 DIAGNOSIS — Z90710 Acquired absence of both cervix and uterus: Secondary | ICD-10-CM

## 2023-04-11 MED ORDER — PREMARIN 0.625 MG/GM VA CREA
1.0000 | TOPICAL_CREAM | Freq: Every day | VAGINAL | 12 refills | Status: DC
Start: 2023-04-11 — End: 2023-10-05
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
## 2023-04-11 NOTE — Assessment & Plan Note (Signed)
-   Reviewed options for managing libido concerns and dyspareunia. Discussed contraindication of migraine with aura for oral estrogen therapy. Will try Premarin cream therapy for vaginal dryness and see if that also helps with libido. Discussed possible libido impact from other medications she is currently taking. She plans on talking with her PCP this week about possible anti-anxiety medications or supplements.

## 2023-04-11 NOTE — Assessment & Plan Note (Signed)
Pap smear collected today 

## 2023-04-11 NOTE — Assessment & Plan Note (Signed)
-   Reviewed health maintenance topics as described below.  -Follow with PCP for regular labs and preventative screenings.

## 2023-04-12 ENCOUNTER — Encounter: Payer: 59 | Admitting: Speech Pathology

## 2023-04-12 ENCOUNTER — Ambulatory Visit: Payer: 59

## 2023-04-14 LAB — CYTOLOGY - PAP
Comment: NEGATIVE
Diagnosis: NEGATIVE
High risk HPV: NEGATIVE

## 2023-04-17 ENCOUNTER — Ambulatory Visit: Payer: 59

## 2023-04-17 ENCOUNTER — Encounter: Payer: 59 | Admitting: Speech Pathology

## 2023-04-19 ENCOUNTER — Ambulatory Visit: Payer: 59

## 2023-04-19 ENCOUNTER — Encounter: Payer: 59 | Admitting: Speech Pathology

## 2023-04-21 ENCOUNTER — Ambulatory Visit
Admission: RE | Admit: 2023-04-21 | Discharge: 2023-04-21 | Disposition: A | Discharge status: Home / Self Care | Payer: BC Managed Care – PPO | Source: Ambulatory Visit | Attending: Family Medicine | Admitting: Family Medicine

## 2023-04-21 DIAGNOSIS — Z1231 Encounter for screening mammogram for malignant neoplasm of breast: Secondary | ICD-10-CM | POA: Diagnosis present

## 2023-07-13 ENCOUNTER — Encounter: Payer: Self-pay | Admitting: Dermatology

## 2023-08-30 ENCOUNTER — Ambulatory Visit: Payer: BC Managed Care – PPO | Admitting: Dermatology

## 2023-09-11 ENCOUNTER — Ambulatory Visit: Payer: BC Managed Care – PPO | Admitting: Dermatology

## 2023-10-05 ENCOUNTER — Ambulatory Visit: Payer: Commercial Managed Care - PPO | Admitting: Dermatology

## 2023-10-05 ENCOUNTER — Encounter: Payer: Self-pay | Admitting: Dermatology

## 2023-10-05 DIAGNOSIS — W908XXA Exposure to other nonionizing radiation, initial encounter: Secondary | ICD-10-CM

## 2023-10-05 DIAGNOSIS — L409 Psoriasis, unspecified: Secondary | ICD-10-CM

## 2023-10-05 DIAGNOSIS — L570 Actinic keratosis: Secondary | ICD-10-CM

## 2023-10-05 DIAGNOSIS — L821 Other seborrheic keratosis: Secondary | ICD-10-CM

## 2023-10-05 DIAGNOSIS — B001 Herpesviral vesicular dermatitis: Secondary | ICD-10-CM

## 2023-10-05 DIAGNOSIS — L814 Other melanin hyperpigmentation: Secondary | ICD-10-CM

## 2023-10-05 DIAGNOSIS — Z1283 Encounter for screening for malignant neoplasm of skin: Secondary | ICD-10-CM | POA: Diagnosis not present

## 2023-10-05 DIAGNOSIS — L4050 Arthropathic psoriasis, unspecified: Secondary | ICD-10-CM

## 2023-10-05 DIAGNOSIS — D1801 Hemangioma of skin and subcutaneous tissue: Secondary | ICD-10-CM

## 2023-10-05 DIAGNOSIS — L578 Other skin changes due to chronic exposure to nonionizing radiation: Secondary | ICD-10-CM

## 2023-10-05 DIAGNOSIS — L720 Epidermal cyst: Secondary | ICD-10-CM

## 2023-10-05 DIAGNOSIS — D229 Melanocytic nevi, unspecified: Secondary | ICD-10-CM

## 2023-10-05 DIAGNOSIS — L708 Other acne: Secondary | ICD-10-CM

## 2023-10-05 DIAGNOSIS — L700 Acne vulgaris: Secondary | ICD-10-CM

## 2023-10-05 MED ORDER — TRETINOIN 0.025 % EX CREA: TOPICAL_CREAM | CUTANEOUS | 5 refills | Status: AC

## 2023-10-05 NOTE — Progress Notes (Signed)
 Follow-Up Visit   Subjective  Desiree Lee is a 48 y.o. female who presents for the following: Skin Cancer Screening and Full Body Skin Exam  The patient presents for Total-Body Skin Exam (TBSE) for skin cancer screening and mole check. The patient has spots, moles and lesions to be evaluated, some may be new or changing and the patient may have concern these could be cancer.  Psoriasis with psoriatic arthritis - on Rinvoq for 4 mths, and using Clobetasol  foam PRN flares, conditions have improved, but s/e of fever blisters and acne. Pt currently using OTC products for acne, but continues to flare. Pt has prescription for Acyclovir and Valtrex, but neither seems to help with fever blisters.  The following portions of the chart were reviewed this encounter and updated as appropriate: medications, allergies, medical history  Review of Systems:  No other skin or systemic complaints except as noted in HPI or Assessment and Plan.  Objective  Well appearing patient in no apparent distress; mood and affect are within normal limits.  A full examination was performed including scalp, head, eyes, ears, nose, lips, neck, chest, axillae, abdomen, back, buttocks, bilateral upper extremities, bilateral lower extremities, hands, feet, fingers, toes, fingernails, and toenails. All findings within normal limits unless otherwise noted below.   Relevant physical exam findings are noted in the Assessment and Plan.  Central sup forehead x 1 Erythematous thin papules/macules with gritty scale.   Assessment & Plan   SKIN CANCER SCREENING PERFORMED TODAY.  ACTINIC DAMAGE - Chronic condition, secondary to cumulative UV/sun exposure - diffuse scaly erythematous macules with underlying dyspigmentation - Recommend daily broad spectrum sunscreen SPF 30+ to sun-exposed areas, reapply every 2 hours as needed.  - Staying in the shade or wearing long sleeves, sun glasses (UVA+UVB protection) and wide brim hats  (4-inch brim around the entire circumference of the hat) are also recommended for sun protection.  - Call for new or changing lesions.  LENTIGINES, SEBORRHEIC KERATOSES, HEMANGIOMAS - Benign normal skin lesions - Benign-appearing - Call for any changes  MELANOCYTIC NEVI - Tan-brown and/or pink-flesh-colored symmetric macules and papules - Benign appearing on exam today - Observation - Call clinic for new or changing moles - Recommend daily use of broad spectrum spf 30+ sunscreen to sun-exposed areas.   PSORIASIS with psoriatic arthritis - rheumatologist Dr. Defoor, prescribed Rinvoq, patient has been on medication for 4 months now.  Exam: Scalp, trunk, extremities clear today. 0% BSA.  Chronic and persistent condition with duration or expected duration over one year. Condition is improving with treatment but not currently at goal.  Pt states that joint pain in the hands, feet, and wrist has improved since starting Rinvoq.   Psoriasis is a chronic non-curable, but treatable genetic/hereditary disease that may have other systemic features affecting other organ systems such as joints (Psoriatic Arthritis). It is associated with an increased risk of inflammatory bowel disease, heart disease, non-alcoholic fatty liver disease, and depression.  Treatments include light and laser treatments; topical medications; and systemic medications including oral and injectables.  Treatment Plan: Continue Rinvoq as prescribed by rheumatologist. Continue Clobetasol  foam to aa's QD-BID PRN. Topical steroids (such as triamcinolone, fluocinolone, fluocinonide, mometasone, clobetasol , halobetasol, betamethasone, hydrocortisone) can cause thinning and lightening of the skin if they are used for too long in the same area. Your physician has selected the right strength medicine for your problem and area affected on the body. Please use your medication only as directed by your physician to prevent side effects.  Milia - left post auricular - tiny firm white papules - type of cyst - benign - sometimes these will clear with nightly OTC adapalene/Differin 0.1% gel or retinol. 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 Condition is bothersome/symptomatic for patient. Currently flared.  Treatment Plan: Start Tretinoin  0.025% cream QHS, to begin use twice per week, and apply a moisturizer afterward. Do this for one month, and if tolerating OK, may increase to one night per week up to QHS if able to tolerate. Topical retinoid medications like tretinoin /Retin-A , adapalene/Differin, tazarotene/Fabior, and Epiduo/Epiduo Forte can cause dryness and irritation when first started. Only apply a pea-sized amount to the entire affected area. Avoid applying it around the eyes, edges of mouth and creases at the nose. If you experience irritation, use a good moisturizer first and/or apply the medicine less often. If you are doing well with the medicine, you can increase how often you use it until you are applying every night. Be careful with sun protection while using this medication as it can make you sensitive to the sun. This medicine should not be used by pregnant women.  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 Condition is symptomatic/ bothersome to patient. Not currently at goal.  Herpes Simplex Virus = Cold Sores = Fever Blisters is a chronic recurring blistering; scabbing sore-producing viral infection that is recurrent usually in the same area triggered by stress, sun/UV exposure and trauma.  It is infectious and can be  spread from person to person by direct contact.  It is not curable, but is treatable with topical and oral medication.  Treatment Plan Continue Acyclovir as prescribed by Dr. Defoor.  Topicals not helpful. Can try abeva OTC if desired  Open sore - L popliteal Exam Geometric pink crusted plaque  - RTC if not resolved in one month. AK (ACTINIC KERATOSIS) Central sup forehead x 1 Actinic keratoses are precancerous spots that appear secondary to cumulative UV radiation exposure/sun exposure over time. They are chronic with expected duration over 1 year. A portion of actinic keratoses will progress to squamous cell carcinoma of the skin. It is not possible to reliably predict which spots will progress to skin cancer and so treatment is recommended to prevent development of skin cancer.  Recommend daily broad spectrum sunscreen SPF 30+ to sun-exposed areas, reapply every 2 hours as needed.  Recommend staying in the shade or wearing long sleeves, sun glasses (UVA+UVB protection) and wide brim hats (4-inch brim around the entire circumference of the hat). Call for new or changing lesions.  Destruction of lesion - Central sup forehead x 1 Complexity: simple   Destruction method: cryotherapy   Informed consent: discussed and consent obtained   Timeout:  patient name, date of birth, surgical site, and procedure verified Lesion destroyed using liquid nitrogen: Yes   Region frozen until ice ball extended beyond lesion: Yes   Outcome: patient tolerated procedure well with no complications   Post-procedure details: wound care instructions given   MULTIPLE BENIGN NEVI   LENTIGINES   ACTINIC ELASTOSIS   SEBORRHEIC KERATOSES   CHERRY ANGIOMA   OTHER ACNE  HERPES LABIALIS   MILIA    Return in about 1 year (around 10/04/2024) for TBSE.  LILLETTE Rosina Mayans, CMA, am acting as scribe for Boneta Sharps, MD .   Documentation: I have reviewed the above documentation for accuracy and  completeness, and I agree with the above.  Boneta Sharps, MD

## 2023-10-05 NOTE — Patient Instructions (Signed)

## 2023-12-06 ENCOUNTER — Other Ambulatory Visit: Payer: Self-pay | Admitting: Rheumatology

## 2023-12-06 DIAGNOSIS — M79671 Pain in right foot: Secondary | ICD-10-CM

## 2024-01-11 ENCOUNTER — Inpatient Hospital Stay
Admission: RE | Admit: 2024-01-11 | Discharge: 2024-01-11 | Disposition: A | Discharge status: Home / Self Care | Payer: Self-pay | Source: Ambulatory Visit | Attending: Neurosurgery | Admitting: Neurosurgery

## 2024-01-11 ENCOUNTER — Other Ambulatory Visit: Payer: Self-pay | Admitting: Family Medicine

## 2024-01-11 DIAGNOSIS — Z049 Encounter for examination and observation for unspecified reason: Secondary | ICD-10-CM

## 2024-01-11 NOTE — Progress Notes (Signed)
 Referring Physician:  Defoor, Herb Loges, PA-C [address] [place],  [place] [address]  Primary Physician:  Lyle San, MD  History of Present Illness: 01/16/2024 [name] is here today with a chief complaint of severe right leg pain, hyperesthesia, and complex regional pain syndrome after right sided far lateral discectomy in February 2025.  She has been evaluated by neurologist to confirm the diagnosis.  She reports changes in temperature sensation, swelling, and discoloration of her right leg in an L5 distribution.  She recently started sympathetic blocks which have helped.  She has tried multiple neuropathic pain medications without significant improvement.  Bowel/Bladder Dysfunction: none  Conservative measures:  Physical therapy: has participated in PT at [facility] including regular antiinflammatories: Methocarbamol , Tramadol, Lyrica  Injections: no epidural steroid injections  Past Surgery: 10/2023 L5-S1 Discectomy  Desiree Lee has no symptoms of cervical myelopathy.  The symptoms are causing a significant impact on the patient's life.   I have utilized the care everywhere function in epic to review the outside records available from external health systems.  Review of Systems:  A 10 point review of systems is negative, except for the pertinent positives and negatives detailed in the HPI.  Past Medical History: Past Medical History:  Diagnosis Date   Chronic sinusitis    Deviated nasal septum    Family history of breast cancer    4/21 cancer genetic testing letter sent   GERD (gastroesophageal reflux disease)    Headache    sinus and migraines    Motion sickness    reading in cars   PONV (postoperative nausea and vomiting)    also, low BP, wakes up slow    Past Surgical History: Past Surgical History:  Procedure Laterality Date   BREAST BIOPSY Left 10/09/2019   affirm bx, x marker, radial scar and ususal ductal  hyperplasia   BREAST LUMPECTOMY Left 12/19/2019   CS[facility], [facility], no atypia or malignancy   BREAST LUMPECTOMY WITH RADIOACTIVE SEED LOCALIZATION Left 12/19/2019   Procedure: LEFT BREAST LUMPECTOMY WITH RADIOACTIVE SEED LOCALIZATION;  Surgeon: Caralyn Chandler, MD;  Location: [facility];  Service: General;  Laterality: Left;   IMAGE GUIDED SINUS SURGERY N/A 01/29/2016   Procedure: IMAGE GUIDED SINUS SURGERY;  Surgeon: Lesly Raspberry, MD;  Location: [facility];  Service: ENT;  Laterality: N/A;   LAPAROSCOPY  2009   for endometriosis   MAXILLARY ANTROSTOMY Bilateral 01/29/2016   Procedure: ENDOSCOPIC MAXILLARY ANTROSTOMY WITH TISSUE;  Surgeon: Lesly Raspberry, MD;  Location: [facility];  Service: ENT;  Laterality: Bilateral;   NASAL TURBINATE REDUCTION Bilateral 01/29/2016   Procedure: TURBINATE REDUCTION/SUBMUCOSAL RESECTION and bilateral SMR middle turbinates.;  Surgeon: Lesly Raspberry, MD;  Location: [facility];  Service: ENT;  Laterality: Bilateral;   OTOPLASTY  1983   SEPTOPLASTY N/A 01/29/2016   Procedure: SEPTOPLASTY;  Surgeon: Lesly Raspberry, MD;  Location: [facility];  Service: ENT;  Laterality: N/A;  DISK GAVE DISK TO Desiree Lee 4-06 KP   TOTAL LAPAROSCOPIC HYSTERECTOMY WITH SALPINGECTOMY Bilateral 01/28/2020   Procedure: TOTAL LAPAROSCOPIC HYSTERECTOMY WITH SALPINGECTOMY;  Surgeon: Alben Alma, MD;  Location: [facility];  Service: Gynecology;  Laterality: Bilateral;  NEED RFNA   TUBAL LIGATION  01/21/14    Allergies: Allergies as of 01/16/2024 - Review Complete 01/16/2024  Allergen Reaction Noted   Bactrim [sulfamethoxazole-trimethoprim] Hives 01/20/2016    Medications:  Current Outpatient Medications:    baclofen (LIORESAL) 10 MG tablet, Take 10  mg by mouth 3 (three) times daily., Disp: , Rfl:    cetirizine (ZYRTEC) 10 MG tablet, Take by mouth., Disp: , Rfl:    clobetasol  (OLUX ) 0.05 % topical foam, Apply topically., Disp: , Rfl:     escitalopram (LEXAPRO) 10 MG tablet, Take 10 mg by mouth daily., Disp: , Rfl:    hydroxychloroquine (PLAQUENIL) 200 MG tablet, Take 200 mg by mouth 2 (two) times daily., Disp: , Rfl:    ketoconazole  (NIZORAL ) 2 % shampoo, Shampoo into scalp let sit 5-10 minutes then wash out. Use 3 days per week., Disp: 120 mL, Rfl: 6   LYRICA 100 MG capsule, Take 100 mg by mouth daily. 2 caps po QD, Disp: , Rfl:    oxyCODONE -acetaminophen  (PERCOCET) 7.5-325 MG tablet, Take 1 tablet by mouth., Disp: , Rfl:    RINVOQ 15 MG TB24, Take 1 tablet by mouth daily., Disp: , Rfl:    rizatriptan (MAXALT) 10 MG tablet, Take by mouth., Disp: , Rfl:    tretinoin  (RETIN-A ) 0.025 % cream, Apply a thin layer to the face at night twice per week for one month. Apply a moisturizer afterward. If tolerating twice per week may gradually increase to QHS as tolerated., Disp: 45 g, Rfl: 5   valACYclovir (VALTREX) 1000 MG tablet, Take 500 mg by mouth., Disp: , Rfl:    Vitamin D, Ergocalciferol, (DRISDOL) 1.25 MG (50000 UNIT) CAPS capsule, Take 50,000 Units by mouth every Tuesday at 6 PM., Disp: , Rfl:    leflunomide (ARAVA) 20 MG tablet, Take by mouth., Disp: , Rfl:    nortriptyline (PAMELOR) 25 MG capsule, Take 25 mg by mouth at bedtime., Disp: , Rfl:   Social History: Social History   Tobacco Use   Smoking status: Former    Current packs/day: 0.00    Types: Cigarettes    Quit date: 09/30/2005    Years since quitting: 18.3   Smokeless tobacco: Never  Substance Use Topics   Alcohol use: No   Drug use: Never    Family Medical History: Family History  Problem Relation Age of Onset   Breast cancer Maternal Aunt 40    Physical Examination: Vitals:   01/16/24 1359  BP: 132/80    General: Patient is in no apparent distress. Attention to examination is appropriate.  Neck:   Supple.  Full range of motion.  Respiratory: Patient is breathing without any difficulty.   NEUROLOGICAL:     Awake, alert, oriented to person,  place, and time.  Speech is clear and fluent.   Cranial Nerves: Pupils equal round and reactive to light.  Facial tone is symmetric.  Facial sensation is symmetric. Shoulder shrug is symmetric. Tongue protrusion is midline.  There is no pronator drift.  Strength: Side Biceps Triceps Deltoid Interossei Grip Wrist Ext. Wrist Flex.  R 5 5 5 5 5 5 5   L 5 5 5 5 5 5 5    Side Iliopsoas Quads Hamstring PF DF EHL  R 5 5 5 5 5 5   L 5 5 5 5 5 5    Reflexes are 1+ and symmetric at the biceps, triceps, brachioradialis, patella and achilles.   Hoffman's is absent.   Bilateral upper and lower extremity sensation is intact to light touch.    No evidence of dysmetria noted.  Gait is normal.     Medical Decision Making  Imaging: MRI L spine 09/09/2023 reviewed.  Right L5-S1 foraminal compression noted.  I have personally reviewed the images and agree with the above  interpretation.  Assessment and Plan: Ms. Huether is a pleasant 48 y.o. female with history of right sided lumbar radiculopathy who developed complex regional pain syndrome after far lateral discectomy.  She is currently try neuropathic pain medications, but has been on 3 pain medications without adequate control.  I encouraged her to consider spinal cord stimulator evaluation.  I have given her information for this and she will think about whether she would like to pursue.  I will be happy to make referrals for thoracic MRI, psychology evaluation, and stimulator evaluation by Dr. Rhesa Celeste once she is ready.   I spent a total of 30 minutes in this patient's care today. This time was spent reviewing pertinent records including imaging studies, obtaining and confirming history, performing a directed evaluation, formulating and discussing my recommendations, and documenting the visit within the medical record.     Thank you for involving me in the care of this patient.      Sya Nestler K. Mont Antis MD, [facility]

## 2024-01-16 ENCOUNTER — Encounter: Payer: Self-pay | Admitting: Neurosurgery

## 2024-01-16 ENCOUNTER — Ambulatory Visit: Admitting: Neurosurgery

## 2024-01-16 VITALS — BP 132/80 | Ht 66.0 in | Wt 171.0 lb

## 2024-01-16 DIAGNOSIS — M5416 Radiculopathy, lumbar region: Secondary | ICD-10-CM

## 2024-01-16 DIAGNOSIS — G90521 Complex regional pain syndrome I of right lower limb: Secondary | ICD-10-CM

## 2024-01-23 ENCOUNTER — Telehealth (HOSPITAL_COMMUNITY): Payer: Self-pay

## 2024-01-23 NOTE — Telephone Encounter (Signed)
 [employer] Coordinator received interest request for TMS tx via [employer] [facility], general practice). Coordinator placed call to pt, no answer. Coordinator left vm requesting pt call back for more info, phone screening, and to confirm if interest is for self or dependent.

## 2024-10-15 ENCOUNTER — Ambulatory Visit: Payer: Commercial Managed Care - PPO | Admitting: Dermatology
